# Patient Record
Sex: Female | Born: 1935 | Hispanic: No | Marital: Single | Smoking: Never smoker
Health system: Southern US, Community
[De-identification: ages and names within clinical notes are randomized; demographics above are authoritative.]

## PROBLEM LIST (undated history)

## (undated) DIAGNOSIS — E079 Disorder of thyroid, unspecified: Secondary | ICD-10-CM

## (undated) DIAGNOSIS — E538 Deficiency of other specified B group vitamins: Secondary | ICD-10-CM

## (undated) DIAGNOSIS — E049 Nontoxic goiter, unspecified: Secondary | ICD-10-CM

## (undated) HISTORY — PX: THYROIDECTOMY, PARTIAL: SHX18

---

## 2015-09-08 ENCOUNTER — Inpatient Hospital Stay (HOSPITAL_COMMUNITY)
Admission: EM | Admit: 2015-09-08 | Discharge: 2015-09-12 | DRG: 480 | Disposition: A | Discharge status: Skilled Nursing Facility | Payer: Medicare Other | Attending: Internal Medicine | Admitting: Internal Medicine

## 2015-09-08 ENCOUNTER — Encounter (HOSPITAL_COMMUNITY): Payer: Self-pay | Admitting: Emergency Medicine

## 2015-09-08 ENCOUNTER — Emergency Department (HOSPITAL_COMMUNITY): Payer: Medicare Other

## 2015-09-08 DIAGNOSIS — D500 Iron deficiency anemia secondary to blood loss (chronic): Secondary | ICD-10-CM | POA: Diagnosis present

## 2015-09-08 DIAGNOSIS — S72141A Displaced intertrochanteric fracture of right femur, initial encounter for closed fracture: Secondary | ICD-10-CM | POA: Diagnosis present

## 2015-09-08 DIAGNOSIS — N390 Urinary tract infection, site not specified: Secondary | ICD-10-CM | POA: Diagnosis present

## 2015-09-08 DIAGNOSIS — E871 Hypo-osmolality and hyponatremia: Secondary | ICD-10-CM | POA: Diagnosis not present

## 2015-09-08 DIAGNOSIS — D638 Anemia in other chronic diseases classified elsewhere: Secondary | ICD-10-CM | POA: Diagnosis not present

## 2015-09-08 DIAGNOSIS — G92 Toxic encephalopathy: Secondary | ICD-10-CM | POA: Diagnosis not present

## 2015-09-08 DIAGNOSIS — R7989 Other specified abnormal findings of blood chemistry: Secondary | ICD-10-CM | POA: Diagnosis present

## 2015-09-08 DIAGNOSIS — I10 Essential (primary) hypertension: Secondary | ICD-10-CM | POA: Diagnosis present

## 2015-09-08 DIAGNOSIS — E440 Moderate protein-calorie malnutrition: Secondary | ICD-10-CM | POA: Diagnosis present

## 2015-09-08 DIAGNOSIS — E0781 Sick-euthyroid syndrome: Secondary | ICD-10-CM | POA: Diagnosis present

## 2015-09-08 DIAGNOSIS — B9620 Unspecified Escherichia coli [E. coli] as the cause of diseases classified elsewhere: Secondary | ICD-10-CM | POA: Diagnosis present

## 2015-09-08 DIAGNOSIS — Y92129 Unspecified place in nursing home as the place of occurrence of the external cause: Secondary | ICD-10-CM

## 2015-09-08 DIAGNOSIS — R52 Pain, unspecified: Secondary | ICD-10-CM

## 2015-09-08 DIAGNOSIS — W010XXA Fall on same level from slipping, tripping and stumbling without subsequent striking against object, initial encounter: Secondary | ICD-10-CM | POA: Diagnosis present

## 2015-09-08 DIAGNOSIS — S72141D Displaced intertrochanteric fracture of right femur, subsequent encounter for closed fracture with routine healing: Secondary | ICD-10-CM | POA: Diagnosis not present

## 2015-09-08 DIAGNOSIS — E538 Deficiency of other specified B group vitamins: Secondary | ICD-10-CM | POA: Diagnosis present

## 2015-09-08 DIAGNOSIS — S72001A Fracture of unspecified part of neck of right femur, initial encounter for closed fracture: Secondary | ICD-10-CM

## 2015-09-08 DIAGNOSIS — D72829 Elevated white blood cell count, unspecified: Secondary | ICD-10-CM | POA: Diagnosis present

## 2015-09-08 DIAGNOSIS — D62 Acute posthemorrhagic anemia: Secondary | ICD-10-CM | POA: Diagnosis not present

## 2015-09-08 DIAGNOSIS — Z66 Do not resuscitate: Secondary | ICD-10-CM | POA: Diagnosis present

## 2015-09-08 DIAGNOSIS — M25551 Pain in right hip: Secondary | ICD-10-CM | POA: Diagnosis present

## 2015-09-08 DIAGNOSIS — Z01811 Encounter for preprocedural respiratory examination: Secondary | ICD-10-CM

## 2015-09-08 DIAGNOSIS — G9340 Encephalopathy, unspecified: Secondary | ICD-10-CM | POA: Diagnosis not present

## 2015-09-08 DIAGNOSIS — Z6821 Body mass index (BMI) 21.0-21.9, adult: Secondary | ICD-10-CM | POA: Diagnosis not present

## 2015-09-08 HISTORY — DX: Nontoxic goiter, unspecified: E04.9

## 2015-09-08 HISTORY — DX: Disorder of thyroid, unspecified: E07.9

## 2015-09-08 HISTORY — DX: Deficiency of other specified B group vitamins: E53.8

## 2015-09-08 LAB — BASIC METABOLIC PANEL
Anion gap: 10 (ref 5–15)
BUN: 19 mg/dL (ref 6–20)
CO2: 24 mmol/L (ref 22–32)
Calcium: 9.2 mg/dL (ref 8.9–10.3)
Chloride: 105 mmol/L (ref 101–111)
Creatinine, Ser: 0.77 mg/dL (ref 0.44–1.00)
GFR calc Af Amer: >60 mL/min (ref >60)
Glucose, Bld: 135 mg/dL — ABNORMAL HIGH (ref 65–99)
POTASSIUM: 4.2 mmol/L (ref 3.5–5.1)
SODIUM: 139 mmol/L (ref 135–145)

## 2015-09-08 LAB — URINALYSIS, ROUTINE W REFLEX MICROSCOPIC
GLUCOSE, UA: NEGATIVE mg/dL
HGB URINE DIPSTICK: NEGATIVE
Ketones, ur: NEGATIVE mg/dL
Nitrite: POSITIVE — AB
PH: 6.5 (ref 5.0–8.0)
Protein, ur: NEGATIVE mg/dL
SPECIFIC GRAVITY, URINE: 1.019 (ref 1.005–1.030)
Urobilinogen, UA: 1 mg/dL (ref 0.0–1.0)

## 2015-09-08 LAB — CBC WITH DIFFERENTIAL/PLATELET
BASOS ABS: 0 10*3/uL (ref 0.0–0.1)
Basophils Relative: 0 %
EOS ABS: 0.1 10*3/uL (ref 0.0–0.7)
EOS PCT: 1 %
HCT: 35 % — ABNORMAL LOW (ref 36.0–46.0)
Hemoglobin: 11.2 g/dL — ABNORMAL LOW (ref 12.0–15.0)
LYMPHS PCT: 10 %
Lymphs Abs: 1.3 10*3/uL (ref 0.7–4.0)
MCH: 28.7 pg (ref 26.0–34.0)
MCHC: 32 g/dL (ref 30.0–36.0)
MCV: 89.7 fL (ref 78.0–100.0)
Monocytes Absolute: 0.5 10*3/uL (ref 0.1–1.0)
Monocytes Relative: 4 %
Neutro Abs: 10.7 10*3/uL — ABNORMAL HIGH (ref 1.7–7.7)
Neutrophils Relative %: 85 %
PLATELETS: 214 10*3/uL (ref 150–400)
RBC: 3.9 MIL/uL (ref 3.87–5.11)
RDW: 13.8 % (ref 11.5–15.5)
WBC: 12.6 10*3/uL — AB (ref 4.0–10.5)

## 2015-09-08 LAB — URINE MICROSCOPIC-ADD ON

## 2015-09-08 LAB — ABO/RH: ABO/RH(D): A POS

## 2015-09-08 LAB — PROTIME-INR
INR: 1.1 (ref 0.00–1.49)
PROTHROMBIN TIME: 14.4 s (ref 11.6–15.2)

## 2015-09-08 MED ORDER — MORPHINE SULFATE (PF) 2 MG/ML IV SOLN
1.0000 mg | Freq: Once | INTRAVENOUS | Status: AC
  Administered 2015-09-08: 1 mg via INTRAVENOUS
  Filled 2015-09-08: qty 1

## 2015-09-08 MED ORDER — MORPHINE SULFATE (PF) 2 MG/ML IV SOLN
0.5000 mg | INTRAVENOUS | Status: DC | PRN
  Administered 2015-09-09 (×2): 0.5 mg via INTRAVENOUS
  Filled 2015-09-08 (×2): qty 1

## 2015-09-08 MED ORDER — METOPROLOL TARTRATE 1 MG/ML IV SOLN
2.5000 mg | Freq: Two times a day (BID) | INTRAVENOUS | Status: AC
  Administered 2015-09-08 – 2015-09-09 (×3): 2.5 mg via INTRAVENOUS
  Filled 2015-09-08 (×3): qty 5

## 2015-09-08 MED ORDER — HYDROMORPHONE HCL 1 MG/ML IJ SOLN: 1.0000 mg | INTRAMUSCULAR | Status: DC | PRN

## 2015-09-08 MED ORDER — BISACODYL 10 MG RE SUPP: 10.0000 mg | Freq: Every day | RECTAL | Status: DC | PRN

## 2015-09-08 MED ORDER — DEXTROSE 5 % IV SOLN
1.0000 g | Freq: Every day | INTRAVENOUS | Status: DC
  Administered 2015-09-08 – 2015-09-11 (×4): 1 g via INTRAVENOUS
  Filled 2015-09-08 (×5): qty 10

## 2015-09-08 MED ORDER — METHOCARBAMOL 1000 MG/10ML IJ SOLN
500.0000 mg | Freq: Four times a day (QID) | INTRAVENOUS | Status: DC | PRN
  Filled 2015-09-08: qty 5

## 2015-09-08 MED ORDER — METHOCARBAMOL 500 MG PO TABS
500.0000 mg | ORAL_TABLET | Freq: Four times a day (QID) | ORAL | Status: DC | PRN
  Administered 2015-09-09: 500 mg via ORAL
  Filled 2015-09-08: qty 1

## 2015-09-08 MED ORDER — POLYETHYLENE GLYCOL 3350 17 G PO PACK: 17.0000 g | PACK | Freq: Every day | ORAL | Status: DC | PRN

## 2015-09-08 MED ORDER — ONDANSETRON HCL 4 MG/2ML IJ SOLN
4.0000 mg | Freq: Once | INTRAMUSCULAR | Status: AC
  Administered 2015-09-08: 4 mg via INTRAVENOUS

## 2015-09-08 MED ORDER — ONDANSETRON HCL 4 MG/2ML IJ SOLN
INTRAMUSCULAR | Status: AC
  Filled 2015-09-08: qty 2

## 2015-09-08 MED ORDER — DOCUSATE SODIUM 100 MG PO CAPS
100.0000 mg | ORAL_CAPSULE | Freq: Two times a day (BID) | ORAL | Status: DC
  Administered 2015-09-09 – 2015-09-12 (×4): 100 mg via ORAL

## 2015-09-08 MED ORDER — SENNA 8.6 MG PO TABS
1.0000 | ORAL_TABLET | Freq: Two times a day (BID) | ORAL | Status: DC
  Administered 2015-09-09 – 2015-09-12 (×4): 8.6 mg via ORAL

## 2015-09-08 MED ORDER — HYDROCODONE-ACETAMINOPHEN 5-325 MG PO TABS
1.0000 | ORAL_TABLET | Freq: Four times a day (QID) | ORAL | Status: DC | PRN
  Administered 2015-09-09 (×2): 1 via ORAL
  Administered 2015-09-11 – 2015-09-12 (×3): 2 via ORAL
  Administered 2015-09-12: 1 via ORAL
  Filled 2015-09-08: qty 2
  Filled 2015-09-08: qty 1
  Filled 2015-09-08: qty 2
  Filled 2015-09-08: qty 1
  Filled 2015-09-08: qty 2
  Filled 2015-09-08: qty 1

## 2015-09-08 NOTE — ED Provider Notes (Signed)
CSN: [id number]     Arrival date & time 09/08/15  1753 History   First MD Initiated Contact with Patient 09/08/15 1800     Chief Complaint  Patient presents with  . Fall  . Hip Pain   HPI  [name] is a 81 year old female with PMHx of goiter presenting with a hip injury. She states that she was walking around her assisted living facility when she tripped over a piece of broken concrete. She fell forward onto her right side. She denies head injury or LOC. Pt states she had immediate hip pain. She describes the pain as "like a muscle ache" and rates it a 4/10. She is unable to move the right leg. She denies other injuries sustained in the fall. She has not had a hip injury in the past. She is not taking blood thinners. Denies fevers, chills, headache, chest pain, SOB, nausea, vomiting, numbness, tingling or loss of sensation in her right leg.  Past Medical History  Diagnosis Date  . Thyroid disease   . Goiter    Past Surgical History  Procedure Laterality Date  . Thyroidectomy, partial     Family History  Problem Relation Age of Onset  . Hypertension Other   . Stroke Mother   . Colon cancer Mother   . Cancer - Colon Father   . Cirrhosis Brother    Social History  Substance Use Topics  . Smoking status: Never Smoker   . Smokeless tobacco: Never Used  . Alcohol Use: No   OB History    No data available     Review of Systems  Constitutional: Negative for fever and chills.  HENT: Negative for congestion and sore throat.   Eyes: Negative for visual disturbance.  Respiratory: Negative for cough and shortness of breath.   Cardiovascular: Negative for chest pain.  Gastrointestinal: Negative for nausea, vomiting and abdominal pain.  Genitourinary: Negative for dysuria and flank pain.  Musculoskeletal: Positive for arthralgias and gait problem. Negative for neck pain.  Skin: Negative for wound.  Neurological: Negative for dizziness, syncope, numbness and headaches.   Hematological: Does not bruise/bleed easily.      Allergies  Penicillins  Home Medications   Prior to Admission medications   Not on File   BP 113/36 mmHg  Pulse 64  Temp(Src) 98.9 F (37.2 C) (Oral)  Resp 16  Ht  (1.651 m)  Wt 130 lb (58.968 kg)  BMI 21.63 kg/m2  SpO2 97% Physical Exam  Constitutional: She appears well-developed and well-nourished. No distress.  HENT:  Head: Normocephalic and atraumatic.  Eyes: Conjunctivae and EOM are normal. Pupils are equal, round, and reactive to light. Right eye exhibits no discharge. Left eye exhibits no discharge. No scleral icterus.  Neck: Normal range of motion.  Cardiovascular: Normal rate and regular rhythm.   Pedal pulses palpable Cap refill < 3  Pulmonary/Chest: Effort normal and breath sounds normal. No respiratory distress. She has no wheezes. She has no rales.  Abdominal: Soft. There is no tenderness.  Musculoskeletal: Normal range of motion.  Right leg shortened and externally rotated. TTP over lateral hip. FROM of ankle and toes.   Neurological: She is alert. No cranial nerve deficit. Coordination normal.  Unable to assess strength of right extremity 2/2 to pain. Sensation over lower legs and feet intact.   Skin: Skin is warm and dry.  Psychiatric: She has a normal mood and affect. Her behavior is normal.  Nursing note and vitals reviewed.  ED Course  Procedures (including critical care time) Labs Review Labs Reviewed  BASIC METABOLIC PANEL - Abnormal; Notable for the following:    Glucose, Bld 135 (*)    All other components within normal limits  CBC WITH DIFFERENTIAL/PLATELET - Abnormal; Notable for the following:    WBC 12.6 (*)    Hemoglobin 11.2 (*)    HCT 35.0 (*)    Neutro Abs 10.7 (*)    All other components within normal limits  URINALYSIS, ROUTINE W REFLEX MICROSCOPIC (NOT AT [facility]) - Abnormal; Notable for the following:    Color, Urine AMBER (*)    APPearance CLOUDY (*)    Bilirubin Urine  SMALL (*)    Nitrite POSITIVE (*)    Leukocytes, UA MODERATE (*)    All other components within normal limits  URINE MICROSCOPIC-ADD ON - Abnormal; Notable for the following:    Bacteria, UA MANY (*)    All other components within normal limits  CBC - Abnormal; Notable for the following:    RBC 3.26 (*)    Hemoglobin 9.2 (*)    HCT 29.0 (*)    All other components within normal limits  BASIC METABOLIC PANEL - Abnormal; Notable for the following:    Sodium 134 (*)    Glucose, Bld 175 (*)    Calcium 8.5 (*)    GFR calc non Af Amer 58 (*)    All other components within normal limits  TSH - Abnormal; Notable for the following:    TSH 0.176 (*)    All other components within normal limits  URINE CULTURE  PROTIME-INR  VITAMIN D 25 HYDROXY  TYPE AND SCREEN  ABO/RH    Imaging Review Dg Chest 1 View  09/08/2015   CLINICAL DATA:  Status post fall, with right hip fracture. Preoperative chest radiograph. Initial encounter.  EXAM: CHEST 1 VIEW  COMPARISON:  None.  FINDINGS: The lungs are well-aerated and clear. There is no evidence of focal opacification, pleural effusion or pneumothorax.  The cardiomediastinal silhouette is borderline normal in size. Diffuse calcification is noted along the descending thoracic aorta. No acute osseous abnormalities are seen. Degenerative change is noted at the acromioclavicular joints bilaterally.  IMPRESSION: No acute cardiopulmonary process seen. No displaced rib fractures identified.   Electronically Signed   By: Roanna Raider M.D.   On: 09/08/2015 22:24   Dg Hip Unilat  With Pelvis 2-3 Views Right  09/08/2015   CLINICAL DATA:  Resident Assisted living facility. Fell on RIGHT side. Pain.  EXAM: DG HIP (WITH OR WITHOUT PELVIS) 2-3V RIGHT  COMPARISON:  None.  FINDINGS: There is a comminuted intertrochanteric fracture with displacement of fragments. Slight varus angulation. No pelvic fracture. Osteopenia. Degenerative change lumbar spine.  IMPRESSION: Comminuted  intertrochanteric fracture with displacement of the fracture fragments. Slight varus angulation. No pelvic fracture.   Electronically Signed   By: Elsie Stain M.D.   On: 09/08/2015 19:01   I have personally reviewed and evaluated these images and lab results as part of my medical decision-making.   EKG Interpretation   Date/Time:  Sunday September 08 2015 20:49:54 EDT Ventricular Rate:  75 PR Interval:    QRS Duration: 92 QT Interval:  407 QTC Calculation: 455 R Axis:     Text Interpretation:  Atrial flutter with predominant 4:1 AV block RSR' in  V1 or V2, right VCD or RVH ED PHYSICIAN INTERPRETATION AVAILABLE IN [facility] Confirmed by TEST, Record ([id number]) on 09/09/2015 7:22:58 AM  MDM   Final diagnoses:  Hip fracture, right, closed, initial encounter (HCC)   Pt presenting after a fall with right leg deformity and pain. Denies other injuries sustained in fall. She is not taking blood thinners. VSS. Pt nontoxic appearing. Right leg is shortened and externally rotated. Leg neurovascularly intact. Hip xray shows displaced hip fracture. Consulted Dr. Magnus Ivan who will see pt but likely not be able to operate until tomorrow. Pt to be kept NPO for now. Pain controlled with morphine. Consulted to Dr. Adela Glimpse who will admit pt.     Alveta Heimlich, PA-C 09/09/15 1242  Elwin Mocha, MD 09/09/15 [phone]

## 2015-09-08 NOTE — ED Notes (Signed)
Bed: ZO10 Expected date:  Expected time:  Means of arrival:  Comments: EMS- hip injury with shortening and rotation

## 2015-09-08 NOTE — H&P (Signed)
PCP: [facility], Trying to go see Dr. Merlene Laughter   Referring provider Rolm Gala   Chief Complaint: Right hip pain  HPI: Shelly Glenn is a 81 y.o. female   has a past medical history of Thyroid disease and Goiter.   Presented with patient was walking on uneven concrete and fell down on her right side. Thereafter she had significant pain and there was shortening of her right leg. Patient is ambulatory at baseline resides at assisted living. EMS was called Patient describes significant pain and unable to ambulate. Patient does not have a regular doctor.  In ER she was found to have comminuted intertrochanteric fracture or displacement of fracture segments on the right. Dr. Rayburn Ma has been consulted and will see her in consult. Plan to keep her nothing by mouth for possible operative intervention tomorrow morning. Ptient not on any blood thinners. She reports no chest pain with ambulation. No shortness of breath. She used to take a medication for her thyroid but no longer on it.  Hospitalist was called for admission for right hip fracture  Review of Systems:    Pertinent positives include:  Leg pain  Constitutional:  No weight loss, night sweats, Fevers, chills, fatigue, weight loss  HEENT:  No headaches, Difficulty swallowing,Tooth/dental problems,Sore throat,  No sneezing, itching, ear ache, nasal congestion, post nasal drip,  Cardio-vascular:  No chest pain, Orthopnea, PND, anasarca, dizziness, palpitations.no Bilateral lower extremity swelling  GI:  No heartburn, indigestion, abdominal pain, nausea, vomiting, diarrhea, change in bowel habits, loss of appetite, melena, blood in stool, hematemesis Resp:  no shortness of breath at rest. No dyspnea on exertion, No excess mucus, no productive cough, No non-productive cough, No coughing up of blood.No change in color of mucus.No wheezing. Skin:  no rash or lesions. No jaundice GU:  no dysuria, change in color of urine, no urgency or  frequency. No straining to urinate.  No flank pain.  Musculoskeletal:  No joint pain or no joint swelling. No decreased range of motion. No back pain.  Psych:  No change in mood or affect. No depression or anxiety. No memory loss.  Neuro: no localizing neurological complaints, no tingling, no weakness, no double vision, no gait abnormality, no slurred speech, no confusion  Otherwise ROS are negative except for above, 10 systems were reviewed  Past Medical History: Past Medical History  Diagnosis Date  . Thyroid disease   . Goiter    Past Surgical History  Procedure Laterality Date  . Thyroidectomy, partial       Medications: Prior to Admission medications   Not on File    Allergies:   Allergies  Allergen Reactions  . Penicillins Itching    Social History:  Ambulatory independently     From facility assisted living   reports that she has never smoked. She has never used smokeless tobacco. She reports that she does not drink alcohol or use illicit drugs.    Family History: family history includes Cancer - Colon in her father; Cirrhosis in her brother; Colon cancer in her mother; Hypertension in her other; Stroke in her mother.    Physical Exam: Patient Vitals for the past 24 hrs:  BP Pulse Resp SpO2 Height Weight  09/08/15 2050 164/61 mmHg 73 18 99 % - -  09/08/15 1756 174/77 mmHg 96 18 98 %  (1.651 m) 58.968 kg (130 lb)    1. General:  in No Acute distress 2. Psychological: Alert and  riented 3. Head/ENT:  Dry Mucous Membranes                          Head Non traumatic, neck supple                          Normal  entition 4. SKIN:  decreased Skin turgor,  Skin clean Dry and intact no rash 5. Heart: Regular rate and rhythm no Murmur, Rub or gallop 6. Lungs: Clear to auscultation bilaterally, no wheezes or crackles   7. Abdomen: Soft, non-tender, Non distended 8. Lower extremities: no clubbing, cyanosis, or edema 9. Neurologically Grossly intact,  moving all 4 extremities equally 10. MSK: Normal range of motion right leg shortened  body mass index is 21.63 kg/(m^2).   Labs on Admission:   Results for orders placed or performed during the hospital encounter of 09/08/15 (from the past 24 hour(s))  Basic metabolic panel     Status: Abnormal   Collection Time: 09/08/15  7:41 PM  Result Value Ref Range   Sodium 139 135 - 145 mmol/L   Potassium 4.2 3.5 - 5.1 mmol/L   Chloride 105 101 - 111 mmol/L   CO2 24 22 - 32 mmol/L   Glucose, Bld 135 (H) 65 - 99 mg/dL   BUN 19 6 - 20 mg/dL   Creatinine, Ser 1.61 0.44 - 1.00 mg/dL   Calcium 9.2 8.9 - 09.6 mg/dL   GFR calc non Af Amer >60 >60 mL/min   GFR calc Af Amer >60 >60 mL/min   Anion gap 10 5 - 15  CBC WITH DIFFERENTIAL     Status: Abnormal   Collection Time: 09/08/15  7:41 PM  Result Value Ref Range   WBC 12.6 (H) 4.0 - 10.5 K/uL   RBC 3.90 3.87 - 5.11 MIL/uL   Hemoglobin 11.2 (L) 12.0 - 15.0 g/dL   HCT 04.5 (L) 40.9 - 81.1 %   MCV 89.7 78.0 - 100.0 fL   MCH 28.7 26.0 - 34.0 pg   MCHC 32.0 30.0 - 36.0 g/dL   RDW 91.4 78.2 - 95.6 %   Platelets 214 150 - 400 K/uL   Neutrophils Relative % 85 %   Neutro Abs 10.7 (H) 1.7 - 7.7 K/uL   Lymphocytes Relative 10 %   Lymphs Abs 1.3 0.7 - 4.0 K/uL   Monocytes Relative 4 %   Monocytes Absolute 0.5 0.1 - 1.0 K/uL   Eosinophils Relative 1 %   Eosinophils Absolute 0.1 0.0 - 0.7 K/uL   Basophils Relative 0 %   Basophils Absolute 0.0 0.0 - 0.1 K/uL  Protime-INR     Status: None   Collection Time: 09/08/15  7:41 PM  Result Value Ref Range   Prothrombin Time 14.4 11.6 - 15.2 seconds   INR 1.10 0.00 - 1.49  Type and screen     Status: None   Collection Time: 09/08/15  7:41 PM  Result Value Ref Range   ABO/RH(D) A POS    Antibody Screen NEG    Sample Expiration 09/11/2015     UA was also con to numerous to count positive nitrites and many bacteria evidence of UTI  No results found for: HGBA1C  Estimated Creatinine Clearance: 41.2  mL/min (by C-G formula based on Cr of 0.77).  BNP (last 3 results) No results for input(s): PROBNP in the last 8760 hours.  Other results:  I have pearsonaly reviewed this: ECG REPORT  Rate  75  Rhythm: Normal sinus rhythm ST&T Change: No ischemic changes QTC within normal limits  Filed Weights   09/08/15 1756  Weight: 58.968 kg (130 lb)     Cultures: No results found for: SDES, SPECREQUEST, CULT, REPTSTATUS   Radiological Exams on Admission: Dg Hip Unilat  With Pelvis 2-3 Views Right  09/08/2015   CLINICAL DATA:  Resident Assisted living facility. Fell on RIGHT side. Pain.  EXAM: DG HIP (WITH OR WITHOUT PELVIS) 2-3V RIGHT  COMPARISON:  None.  FINDINGS: There is a comminuted intertrochanteric fracture with displacement of fragments. Slight varus angulation. No pelvic fracture. Osteopenia. Degenerative change lumbar spine.  IMPRESSION: Comminuted intertrochanteric fracture with displacement of the fracture fragments. Slight varus angulation. No pelvic fracture.   Electronically Signed   By: Elsie Stain M.D.   On: 09/08/2015 19:01    Chart has been reviewed  Family   at  Bedside  plan of care was discussed with Niece  Assessment/Plan  87 yo F w no significant Past medical hx here with Right hip fracture after mechanical fall and UTI  Present on Admission:  . Closed right hip fracture (HCC) [facility]. Given advanced patient patient is at least moderate risk but she has been ambulatory without chest pains or shortness of breath. No further workup indicated prior to proceeding to OR, we'll start low-dose perioperative beta blocker  . UTI (lower urinary tract infection) - will start on Rocephin and await results of urine culture Leukocytosis in the setting UTI and stressed margination from fall  Prophylaxis: SCD    CODE STATUS: DNR/DNI as per patient   Disposition:    likely will need placement for rehabilitation            Other plan as per orders.  I have  spent a total of 55 min on this admission  Shelly Glenn 09/08/2015, 9:10 PM  [facility]  Pager [phone]   after 2 AM please page floor coverage PA If 7AM-7PM, please contact the day team taking care of the patient  [email or web address]  Password [email or web address]

## 2015-09-08 NOTE — ED Notes (Signed)
Pt presents via EMS from assisted living facility after walking over uneven concrete and falling onto her right side.  There is visible shortening and outward rotation of the right leg, and pt c/o 4/10 right thigh pain.  She is alert and oriented and ambulatory at baseline. Small skin tear on right elbow bandaged by EMS. All vs WNL en route.

## 2015-09-08 NOTE — Consult Note (Signed)
Reason for Consult:  Right hip fracture Referring Physician: EDP  Shelly Glenn is an 81 y.o. female.  HPI:   81 yo female resident of assisted living sustained an accidental mechanical fall this afternoon while up ambulating with her walker.  Was transported to the [facility] and found to have a displaced right hip intertrochanteric fracture.  She complains only of right hip pain.  Her niece is at the bedside.  Ortho is consulted to evaluate and treat the broken hip.  Past Medical History  Diagnosis Date  . Thyroid disease   . Goiter     Past Surgical History  Procedure Laterality Date  . Thyroidectomy, partial      No family history on file.  Social History:  reports that she has never smoked. She has never used smokeless tobacco. She reports that she does not drink alcohol or use illicit drugs.  Allergies:  Allergies  Allergen Reactions  . Penicillins Itching    Medications: I have reviewed the patient's current medications.  Results for orders placed or performed during the hospital encounter of 09/08/15 (from the past 48 hour(s))  Basic metabolic panel     Status: Abnormal   Collection Time: 09/08/15  7:41 PM  Result Value Ref Range   Sodium 139 135 - 145 mmol/L   Potassium 4.2 3.5 - 5.1 mmol/L   Chloride 105 101 - 111 mmol/L   CO2 24 22 - 32 mmol/L   Glucose, Bld 135 (H) 65 - 99 mg/dL   BUN 19 6 - 20 mg/dL   Creatinine, Ser 0.77 0.44 - 1.00 mg/dL   Calcium 9.2 8.9 - 10.3 mg/dL   GFR calc non Af Amer >60 >60 mL/min   GFR calc Af Amer >60 >60 mL/min    Comment: (NOTE) The eGFR has been calculated using the CKD EPI equation. This calculation has not been validated in all clinical situations. eGFR's persistently <60 mL/min signify possible Chronic Kidney Disease.    Anion gap 10 5 - 15  CBC WITH DIFFERENTIAL     Status: Abnormal   Collection Time: 09/08/15  7:41 PM  Result Value Ref Range   WBC 12.6 (H) 4.0 - 10.5 K/uL   RBC 3.90 3.87 - 5.11 MIL/uL   Hemoglobin 11.2 (L) 12.0 - 15.0 g/dL   HCT 35.0 (L) 36.0 - 46.0 %   MCV 89.7 78.0 - 100.0 fL   MCH 28.7 26.0 - 34.0 pg   MCHC 32.0 30.0 - 36.0 g/dL   RDW 13.8 11.5 - 15.5 %   Platelets 214 150 - 400 K/uL   Neutrophils Relative % 85 %   Neutro Abs 10.7 (H) 1.7 - 7.7 K/uL   Lymphocytes Relative 10 %   Lymphs Abs 1.3 0.7 - 4.0 K/uL   Monocytes Relative 4 %   Monocytes Absolute 0.5 0.1 - 1.0 K/uL   Eosinophils Relative 1 %   Eosinophils Absolute 0.1 0.0 - 0.7 K/uL   Basophils Relative 0 %   Basophils Absolute 0.0 0.0 - 0.1 K/uL  Protime-INR     Status: None   Collection Time: 09/08/15  7:41 PM  Result Value Ref Range   Prothrombin Time 14.4 11.6 - 15.2 seconds   INR 1.10 0.00 - 1.49  Type and screen     Status: None   Collection Time: 09/08/15  7:41 PM  Result Value Ref Range   ABO/RH(D) A POS    Antibody Screen NEG    Sample Expiration 09/11/2015  Dg Hip Unilat  With Pelvis 2-3 Views Right  09/08/2015   CLINICAL DATA:  Resident Assisted living facility. Fell on RIGHT side. Pain.  EXAM: DG HIP (WITH OR WITHOUT PELVIS) 2-3V RIGHT  COMPARISON:  None.  FINDINGS: There is a comminuted intertrochanteric fracture with displacement of fragments. Slight varus angulation. No pelvic fracture. Osteopenia. Degenerative change lumbar spine.  IMPRESSION: Comminuted intertrochanteric fracture with displacement of the fracture fragments. Slight varus angulation. No pelvic fracture.   Electronically Signed   By: Staci Righter M.D.   On: 09/08/2015 19:01    ROS Blood pressure 164/61, pulse 73, resp. rate 18, height 5' 5"  (1.651 m), weight 58.968 kg (130 lb), SpO2 99 %. Physical Exam  Constitutional: She is oriented to person, place, and time. She appears well-developed and well-nourished.  HENT:  Head: Normocephalic and atraumatic.  Eyes: Pupils are equal, round, and reactive to light.  Neck: Normal range of motion. Neck supple.  Cardiovascular: Normal rate.   Respiratory: Effort normal and  breath sounds normal.  GI: Bowel sounds are normal.  Musculoskeletal:       Right hip: She exhibits decreased range of motion, decreased strength, bony tenderness and deformity.  Neurological: She is alert and oriented to person, place, and time.  Skin: Skin is warm and dry.  Psychiatric: She has a normal mood and affect.    Assessment/Plan: Displaced right hip intertrochanteric fracture. 1) I spoke with her and her family in length about her right hip fracture and showed them the x-rays.  She does need surgical fixation of this fracture with a rod/hipscrew construct.  Due to OR availability, will plan on surgery late tomorrow.  She can eat now and have breakfast in the am.  The risks and benefits of surgery were discussed in detail.  General Medicine is consulted as well for medical clearance and admission.  BLACKMAN,CHRISTOPHER Y 09/08/2015, 9:02 PM

## 2015-09-09 ENCOUNTER — Inpatient Hospital Stay (HOSPITAL_COMMUNITY): Payer: Medicare Other | Admitting: Anesthesiology

## 2015-09-09 ENCOUNTER — Encounter (HOSPITAL_COMMUNITY): Payer: Self-pay | Admitting: Anesthesiology

## 2015-09-09 ENCOUNTER — Inpatient Hospital Stay (HOSPITAL_COMMUNITY): Payer: Medicare Other

## 2015-09-09 ENCOUNTER — Encounter (HOSPITAL_COMMUNITY)
Admission: EM | Disposition: A | Discharge status: Skilled Nursing Facility | Payer: Self-pay | Source: Home / Self Care | Attending: Internal Medicine

## 2015-09-09 DIAGNOSIS — D72829 Elevated white blood cell count, unspecified: Secondary | ICD-10-CM

## 2015-09-09 DIAGNOSIS — N390 Urinary tract infection, site not specified: Secondary | ICD-10-CM

## 2015-09-09 DIAGNOSIS — D638 Anemia in other chronic diseases classified elsewhere: Secondary | ICD-10-CM | POA: Diagnosis present

## 2015-09-09 DIAGNOSIS — I10 Essential (primary) hypertension: Secondary | ICD-10-CM | POA: Diagnosis present

## 2015-09-09 DIAGNOSIS — E871 Hypo-osmolality and hyponatremia: Secondary | ICD-10-CM | POA: Diagnosis not present

## 2015-09-09 DIAGNOSIS — S72141A Displaced intertrochanteric fracture of right femur, initial encounter for closed fracture: Principal | ICD-10-CM

## 2015-09-09 HISTORY — PX: INTRAMEDULLARY (IM) NAIL INTERTROCHANTERIC: SHX5875

## 2015-09-09 LAB — CBC
HEMATOCRIT: 29 % — AB (ref 36.0–46.0)
HEMOGLOBIN: 9.2 g/dL — AB (ref 12.0–15.0)
MCH: 28.2 pg (ref 26.0–34.0)
MCHC: 31.7 g/dL (ref 30.0–36.0)
MCV: 89 fL (ref 78.0–100.0)
Platelets: 197 10*3/uL (ref 150–400)
RBC: 3.26 MIL/uL — AB (ref 3.87–5.11)
RDW: 13.8 % (ref 11.5–15.5)
WBC: 8.1 10*3/uL (ref 4.0–10.5)

## 2015-09-09 LAB — BASIC METABOLIC PANEL
ANION GAP: 9 (ref 5–15)
BUN: 18 mg/dL (ref 6–20)
CHLORIDE: 101 mmol/L (ref 101–111)
CO2: 24 mmol/L (ref 22–32)
CREATININE: 0.85 mg/dL (ref 0.44–1.00)
Calcium: 8.5 mg/dL — ABNORMAL LOW (ref 8.9–10.3)
GFR calc non Af Amer: 58 mL/min — ABNORMAL LOW (ref >60)
Glucose, Bld: 175 mg/dL — ABNORMAL HIGH (ref 65–99)
POTASSIUM: 4.1 mmol/L (ref 3.5–5.1)
SODIUM: 134 mmol/L — AB (ref 135–145)

## 2015-09-09 LAB — TSH: TSH: 0.176 u[IU]/mL — AB (ref 0.350–4.500)

## 2015-09-09 LAB — SURGICAL PCR SCREEN
MRSA, PCR: NEGATIVE
STAPHYLOCOCCUS AUREUS: NEGATIVE

## 2015-09-09 SURGERY — FIXATION, FRACTURE, INTERTROCHANTERIC, WITH INTRAMEDULLARY ROD
Anesthesia: Spinal | Site: Hip | Laterality: Right

## 2015-09-09 MED ORDER — MEPERIDINE HCL 25 MG/ML IJ SOLN: 6.2500 mg | Freq: Once | INTRAMUSCULAR | Status: DC

## 2015-09-09 MED ORDER — FENTANYL CITRATE (PF) 100 MCG/2ML IJ SOLN
INTRAMUSCULAR | Status: DC | PRN
  Administered 2015-09-09: 25 ug via INTRAVENOUS

## 2015-09-09 MED ORDER — PROMETHAZINE HCL 25 MG/ML IJ SOLN
INTRAMUSCULAR | Status: AC
  Filled 2015-09-09: qty 1

## 2015-09-09 MED ORDER — ONDANSETRON HCL 4 MG PO TABS
4.0000 mg | ORAL_TABLET | Freq: Four times a day (QID) | ORAL | Status: DC | PRN
Start: 2015-09-09 — End: 2015-09-12
  Administered 2015-09-12: 4 mg via ORAL
  Filled 2015-09-09: qty 1

## 2015-09-09 MED ORDER — METOCLOPRAMIDE HCL 5 MG/ML IJ SOLN: 5.0000 mg | Freq: Three times a day (TID) | INTRAMUSCULAR | Status: DC | PRN

## 2015-09-09 MED ORDER — HYDROCODONE-ACETAMINOPHEN 5-325 MG PO TABS: 1.0000 | ORAL_TABLET | Freq: Four times a day (QID) | ORAL | Status: DC | PRN

## 2015-09-09 MED ORDER — KETAMINE HCL 10 MG/ML IJ SOLN
INTRAMUSCULAR | Status: AC
  Filled 2015-09-09: qty 1

## 2015-09-09 MED ORDER — PROPOFOL 500 MG/50ML IV EMUL
INTRAVENOUS | Status: DC | PRN
  Administered 2015-09-09: 10 ug/kg/min via INTRAVENOUS

## 2015-09-09 MED ORDER — METHOCARBAMOL 1000 MG/10ML IJ SOLN
500.0000 mg | Freq: Four times a day (QID) | INTRAVENOUS | Status: DC | PRN
  Filled 2015-09-09: qty 5

## 2015-09-09 MED ORDER — METHOCARBAMOL 1000 MG/10ML IJ SOLN
500.0000 mg | Freq: Four times a day (QID) | INTRAVENOUS | Status: DC | PRN
  Administered 2015-09-09 – 2015-09-10 (×2): 500 mg via INTRAVENOUS
  Filled 2015-09-09 (×5): qty 5

## 2015-09-09 MED ORDER — CEFAZOLIN SODIUM-DEXTROSE 2-3 GM-% IV SOLR
2.0000 g | Freq: Once | INTRAVENOUS | Status: AC
  Administered 2015-09-09: 2 g via INTRAVENOUS

## 2015-09-09 MED ORDER — ONDANSETRON HCL 4 MG/2ML IJ SOLN: 4.0000 mg | Freq: Once | INTRAMUSCULAR | Status: DC

## 2015-09-09 MED ORDER — PROMETHAZINE HCL 25 MG/ML IJ SOLN
6.2500 mg | INTRAMUSCULAR | Status: DC | PRN
  Administered 2015-09-09: 6.25 mg via INTRAVENOUS

## 2015-09-09 MED ORDER — CEFAZOLIN SODIUM-DEXTROSE 2-3 GM-% IV SOLR: 2.0000 g | Freq: Four times a day (QID) | INTRAVENOUS | Status: DC

## 2015-09-09 MED ORDER — SODIUM CHLORIDE 0.9 % IV SOLN
INTRAVENOUS | Status: AC
  Administered 2015-09-09: 09:00:00 via INTRAVENOUS

## 2015-09-09 MED ORDER — METHOCARBAMOL 500 MG PO TABS
500.0000 mg | ORAL_TABLET | Freq: Four times a day (QID) | ORAL | Status: DC | PRN
  Administered 2015-09-12: 500 mg via ORAL
  Filled 2015-09-09: qty 1

## 2015-09-09 MED ORDER — LACTATED RINGERS IV SOLN
INTRAVENOUS | Status: DC | PRN
  Administered 2015-09-09 (×2): via INTRAVENOUS

## 2015-09-09 MED ORDER — ACETAMINOPHEN 325 MG PO TABS
650.0000 mg | ORAL_TABLET | Freq: Four times a day (QID) | ORAL | Status: DC | PRN
  Administered 2015-09-10: 650 mg via ORAL
  Filled 2015-09-09: qty 2

## 2015-09-09 MED ORDER — MORPHINE SULFATE (PF) 2 MG/ML IV SOLN: 1.0000 mg | INTRAVENOUS | Status: DC | PRN

## 2015-09-09 MED ORDER — MENTHOL 3 MG MT LOZG: 1.0000 | LOZENGE | OROMUCOSAL | Status: DC | PRN

## 2015-09-09 MED ORDER — FENTANYL CITRATE (PF) 100 MCG/2ML IJ SOLN: 25.0000 ug | INTRAMUSCULAR | Status: DC | PRN

## 2015-09-09 MED ORDER — MEPERIDINE HCL 50 MG/ML IJ SOLN
INTRAMUSCULAR | Status: AC
  Filled 2015-09-09: qty 1

## 2015-09-09 MED ORDER — MORPHINE SULFATE (PF) 2 MG/ML IV SOLN: 0.5000 mg | INTRAVENOUS | Status: DC | PRN

## 2015-09-09 MED ORDER — OXYCODONE HCL 5 MG PO TABS: 5.0000 mg | ORAL_TABLET | ORAL | Status: DC | PRN

## 2015-09-09 MED ORDER — KETAMINE HCL 50 MG/ML IJ SOLN
INTRAMUSCULAR | Status: DC | PRN
  Administered 2015-09-09: 10 mg via INTRAMUSCULAR
  Administered 2015-09-09: 30 mg via INTRAMUSCULAR

## 2015-09-09 MED ORDER — HYDROMORPHONE HCL 1 MG/ML IJ SOLN: 1.0000 mg | INTRAMUSCULAR | Status: DC | PRN

## 2015-09-09 MED ORDER — ONDANSETRON HCL 4 MG/2ML IJ SOLN
4.0000 mg | Freq: Four times a day (QID) | INTRAMUSCULAR | Status: DC | PRN
  Filled 2015-09-09: qty 2

## 2015-09-09 MED ORDER — PROPOFOL 10 MG/ML IV BOLUS
INTRAVENOUS | Status: AC
  Filled 2015-09-09: qty 20

## 2015-09-09 MED ORDER — PHENOL 1.4 % MT LIQD
1.0000 | OROMUCOSAL | Status: DC | PRN
  Filled 2015-09-09: qty 177

## 2015-09-09 MED ORDER — PHENYLEPHRINE HCL 10 MG/ML IJ SOLN
10.0000 mg | INTRAVENOUS | Status: DC | PRN
  Administered 2015-09-09: 40 ug/min via INTRAVENOUS

## 2015-09-09 MED ORDER — METOCLOPRAMIDE HCL 10 MG PO TABS: 5.0000 mg | ORAL_TABLET | Freq: Three times a day (TID) | ORAL | Status: DC | PRN

## 2015-09-09 MED ORDER — ONDANSETRON HCL 4 MG/2ML IJ SOLN
4.0000 mg | Freq: Four times a day (QID) | INTRAMUSCULAR | Status: DC | PRN
  Administered 2015-09-09: 4 mg via INTRAVENOUS

## 2015-09-09 MED ORDER — ONDANSETRON HCL 4 MG/2ML IJ SOLN
INTRAMUSCULAR | Status: AC
  Filled 2015-09-09: qty 2

## 2015-09-09 MED ORDER — ACETAMINOPHEN 650 MG RE SUPP: 650.0000 mg | Freq: Four times a day (QID) | RECTAL | Status: DC | PRN

## 2015-09-09 MED ORDER — FENTANYL CITRATE (PF) 100 MCG/2ML IJ SOLN
INTRAMUSCULAR | Status: AC
  Filled 2015-09-09: qty 4

## 2015-09-09 MED ORDER — CEFAZOLIN SODIUM-DEXTROSE 2-3 GM-% IV SOLR
INTRAVENOUS | Status: AC
  Filled 2015-09-09: qty 50

## 2015-09-09 MED ORDER — SODIUM CHLORIDE 0.9 % IJ SOLN
INTRAMUSCULAR | Status: AC
  Filled 2015-09-09: qty 10

## 2015-09-09 MED ORDER — ASPIRIN EC 325 MG PO TBEC
325.0000 mg | DELAYED_RELEASE_TABLET | Freq: Every day | ORAL | Status: DC
  Administered 2015-09-10 – 2015-09-12 (×3): 325 mg via ORAL
  Filled 2015-09-09 (×4): qty 1

## 2015-09-09 MED ORDER — BUPIVACAINE IN DEXTROSE 0.75-8.25 % IT SOLN
INTRATHECAL | Status: DC | PRN
  Administered 2015-09-09: 1.6 mL via INTRATHECAL

## 2015-09-09 SURGICAL SUPPLY — 42 items
BLADE SURG 15 STRL LF DISP TIS (BLADE) ×1 IMPLANT
BLADE SURG 15 STRL SS (BLADE) ×2
BNDG GAUZE ELAST 4 BULKY (GAUZE/BANDAGES/DRESSINGS) ×3 IMPLANT
COVER PERINEAL POST (MISCELLANEOUS) ×3 IMPLANT
COVER SURGICAL LIGHT HANDLE (MISCELLANEOUS) IMPLANT
DRAPE INCISE IOBAN 85X60 (DRAPES) ×3 IMPLANT
DRAPE PROXIMA HALF (DRAPES) ×3 IMPLANT
DRAPE STERI IOBAN 125X83 (DRAPES) ×3 IMPLANT
DRSG MEPILEX BORDER 4X4 (GAUZE/BANDAGES/DRESSINGS) ×6 IMPLANT
DRSG MEPILEX BORDER 4X8 (GAUZE/BANDAGES/DRESSINGS) IMPLANT
DRSG PAD ABDOMINAL 8X10 ST (GAUZE/BANDAGES/DRESSINGS) ×6 IMPLANT
DURAPREP 26ML APPLICATOR (WOUND CARE) ×3 IMPLANT
ELECT REM PT RETURN 9FT ADLT (ELECTROSURGICAL) ×3
ELECTRODE REM PT RTRN 9FT ADLT (ELECTROSURGICAL) ×1 IMPLANT
FACESHIELD WRAPAROUND (MASK) ×3 IMPLANT
GAUZE XEROFORM 5X9 LF (GAUZE/BANDAGES/DRESSINGS) IMPLANT
GLOVE BIOGEL PI IND STRL 8 (GLOVE) ×2 IMPLANT
GLOVE BIOGEL PI INDICATOR 8 (GLOVE) ×4
GLOVE ECLIPSE 8.0 STRL XLNG CF (GLOVE) ×3 IMPLANT
GLOVE ORTHO TXT STRL SZ7.5 (GLOVE) ×3 IMPLANT
GOWN STRL REUS W/ TWL XL LVL3 (GOWN DISPOSABLE) IMPLANT
GOWN STRL REUS W/TWL LRG LVL3 (GOWN DISPOSABLE) ×3 IMPLANT
GOWN STRL REUS W/TWL XL LVL3 (GOWN DISPOSABLE)
GUIDEPIN 3.2X17.5 THRD DISP (PIN) ×3 IMPLANT
HFN RH 130 DEG 9MM X 340MM (Nail) ×3 IMPLANT
HIP FRAC NAIL LAG SCR 10.5X100 (Orthopedic Implant) ×2 IMPLANT
KIT BASIN OR (CUSTOM PROCEDURE TRAY) ×3 IMPLANT
KIT ROOM TURNOVER OR (KITS) ×3 IMPLANT
MANIFOLD NEPTUNE II (INSTRUMENTS) ×3 IMPLANT
NS IRRIG 1000ML POUR BTL (IV SOLUTION) ×3 IMPLANT
PACK GENERAL/GYN (CUSTOM PROCEDURE TRAY) ×3 IMPLANT
PAD ARMBOARD 7.5X6 YLW CONV (MISCELLANEOUS) ×6 IMPLANT
PAD CAST 4YDX4 CTTN HI CHSV (CAST SUPPLIES) IMPLANT
PADDING CAST COTTON 4X4 STRL (CAST SUPPLIES)
SCREW CANN THRD AFF 10.5X100 (Orthopedic Implant) ×1 IMPLANT
STAPLER VISISTAT 35W (STAPLE) IMPLANT
SUT VIC AB 0 CT1 27 (SUTURE) ×4
SUT VIC AB 0 CT1 27XBRD ANBCTR (SUTURE) ×2 IMPLANT
SUT VIC AB 2-0 CT1 27 (SUTURE) ×4
SUT VIC AB 2-0 CT1 TAPERPNT 27 (SUTURE) ×2 IMPLANT
TOWEL OR 17X24 6PK STRL BLUE (TOWEL DISPOSABLE) ×3 IMPLANT
TOWEL OR 17X26 10 PK STRL BLUE (TOWEL DISPOSABLE) ×3 IMPLANT

## 2015-09-09 NOTE — Anesthesia Preprocedure Evaluation (Addendum)
Anesthesia Evaluation  Patient identified by MRN, date of birth, ID band Patient awake    Reviewed: Allergy & Precautions, NPO status , Patient's Chart, lab work & pertinent test results  Airway Mallampati: II  TM Distance: >3 FB Neck ROM: Full    Dental   Pulmonary neg pulmonary ROS,    breath sounds clear to auscultation       Cardiovascular hypertension,  Rhythm:Regular Rate:Normal     Neuro/Psych    GI/Hepatic negative GI ROS, Neg liver ROS,   Endo/Other  negative endocrine ROS  Renal/GU negative Renal ROS     Musculoskeletal   Abdominal   Peds  Hematology   Anesthesia Other Findings   Reproductive/Obstetrics                            Anesthesia Physical Anesthesia Plan  ASA: III  Anesthesia Plan: Spinal   Post-op Pain Management:    Induction: Intravenous  Airway Management Planned: Simple Face Mask  Additional Equipment:   Intra-op Plan:   Post-operative Plan:   Informed Consent: I have reviewed the patients History and Physical, chart, labs and discussed the procedure including the risks, benefits and alternatives for the proposed anesthesia with the patient or authorized representative who has indicated his/her understanding and acceptance.   Dental advisory given  Plan Discussed with: CRNA and Anesthesiologist  Anesthesia Plan Comments:         Anesthesia Quick Evaluation

## 2015-09-09 NOTE — Anesthesia Procedure Notes (Signed)
Spinal Patient location during procedure: OR Start time: 09/09/2015 5:39 PM End time: 09/09/2015 5:50 PM Reason for block: at surgeon's request Staffing Anesthesiologist: Finis Bud Resident/CRNA: Anne Fu Performed by: resident/CRNA and anesthesiologist  Preanesthetic Checklist Completed: patient identified, site marked, surgical consent, pre-op evaluation, timeout performed, IV checked, risks and benefits discussed, monitors and equipment checked and at surgeon's request Spinal Block Patient position: sitting Approach: midline Location: L3-4 Injection technique: single-shot Needle Needle type: Spinocan  Needle gauge: 22 G Needle length: 9 cm Assessment Sensory level: T6 Additional Notes Expiration date of kit checked and confirmed. Patient tolerated procedure well, without complications. X 2 attempt with noted clear CSF return. Loss of motor and sensory on exam post injection.

## 2015-09-09 NOTE — Progress Notes (Signed)
Patient ID: Shelly Glenn, female   DOB: 11-29-1921, 81 y.o.   MRN: [id number]  [facility] PROGRESS NOTE  Lailany Enoch ION:[id number] DOB: Dec 10, 1920 DOA: 09/08/2015 PCP: No primary care provider on file.   Brief narrative:    81 y.o. female presented to [facility] ED after an episode of fall while walking on an uneven concrete. She fell on right side and has sustained right hip fracture. Ortho team consulted and [facility] asked to admit.   Assessment/Plan:    Principal Problem:   Closed intertrochanteric fracture of right hip (HCC) - plan to take to OR this afternoon - keep NPO for now and provide analgesia as needed - appreciate ortho team assistance   Active Problems:   UTI (lower urinary tract infection) - continue rocephin day #2 - follow up on urine cultures    Leukocytosis - likely multifactorial and secondary to hip fracture and UTI - WNL this AM - repeat CBC in AM    Accelerated hypertension - SBP in 170's on admission and likely exacerbated by pain - BP more stable this AM - continue Metoprolol IV BID for now and change to PO post op    Acute hyponatremia - slight drop overnight, provide IVF 75 cc/hr for next 12 hours  - repeat BMP in AM    Anemia of chronic disease, ? IDA - drop in Hg since admission - anemia panel requested, unknown baseline Hg - CBC in AM  DVT prophylaxis - SCD's  Code Status: DNR Family Communication:  plan of care discussed with the patient Disposition Plan: not ready for d/c yet   IV access:  Peripheral IV  Procedures and diagnostic studies:    Dg Chest 1 View 09/08/2015   No acute cardiopulmonary process seen. No displaced rib fractures identified.    Dg Hip Unilat  With Pelvis 2-3 Views Right 09/08/2015  Comminuted intertrochanteric fracture with displacement of the fracture fragments. Slight varus angulation.   Medical Consultants:  Ortho   Other Consultants:  None  IAnti-Infectives:   Rocephin 10/02 -->  Debbora Presto, MD  [facility] Pager [phone]  If 7PM-7AM, please contact night-coverage [email or web address] Password [email or web address] 09/09/2015, 8:08 AM   LOS: 1 day   HPI/Subjective: No events overnight. Still with right hip pain.  Objective: Filed Vitals:   09/08/15 2145 09/08/15 2213 09/08/15 2230 09/09/15 0535  BP:  151/58 152/56 134/50  Pulse: 73 73 81 73  Temp:  99 F (37.2 C) 98.7 F (37.1 C) 98.9 F (37.2 C)  TempSrc:  Oral Oral Oral  Resp: Height:      Weight:      SpO2: 100% 100% 100% 98%    Intake/Output Summary (Last 24 hours) at 09/09/15 0808 Last data filed at 09/09/15 0536  Gross per 24 hour  Intake    170 ml  Output    300 ml  Net   -130 ml    Exam:   General:  Pt is alert, follows commands appropriately, not in acute distress  Cardiovascular: Regular rate and rhythm, no rubs, no gallops  Respiratory: Clear to auscultation bilaterally, no wheezing, no crackles, no rhonchi  Abdomen: Soft, non tender, non distended, bowel sounds present, no guarding  Extremities: pulses DP and PT palpable bilaterally  Neuro: Grossly nonfocal  Data Reviewed: Basic Metabolic Panel:  Recent Labs Lab 09/08/15 1941 09/09/15 0455  NA 139 134*  K 4.2 4.1  CL 105 101  CO2 24 24  GLUCOSE 135* 175*  BUN 19 18  CREATININE 0.77 0.85  CALCIUM 9.2 8.5*   CBC:  Recent Labs Lab 09/08/15 1941 09/09/15 0455  WBC 12.6* 8.1  NEUTROABS 10.7*  --   HGB 11.2* 9.2*  HCT 35.0* 29.0*  MCV 89.7 89.0  PLT 214 197   Scheduled Meds: . cefTRIAXone (ROCEPHIN) IVPB 1 gram/50 mL D5W  1 g Intravenous QHS  . docusate sodium  100 mg Oral BID  . metoprolol  2.5 mg Intravenous Q12H  . senna  1 tablet Oral BID   Continuous Infusions:

## 2015-09-09 NOTE — Progress Notes (Signed)
Patient ID: Shelly Glenn, female   DOB: [birth year], 81 y.o.   MRN: [id number] Surgery planned for late today around 5 pm to address her right hip fracture.

## 2015-09-09 NOTE — Transfer of Care (Signed)
Immediate Anesthesia Transfer of Care Note  Patient: Shelly Glenn  Procedure(s) Performed: Procedure(s): INTRAMEDULLARY (IM) NAIL INTERTROCHANTRIC (Right)  Patient Location: PACU  Anesthesia Type:Spinal  Level of Consciousness: awake  Airway & Oxygen Therapy: Patient Spontanous Breathing and Patient connected to face mask oxygen  Post-op Assessment: Report given to RN and Post -op Vital signs reviewed and stable  Post vital signs: Reviewed and stable  Last Vitals:  Filed Vitals:   09/09/15 1600  BP: 131/54  Pulse: 64  Temp: 37.2 C  Resp: 18    Complications: No apparent anesthesia complications

## 2015-09-09 NOTE — Brief Op Note (Signed)
09/08/2015 - 09/09/2015  7:12 PM  PATIENT:  Shelly Glenn  81 y.o. female  PRE-OPERATIVE DIAGNOSIS:  right intertrochanteric hip fracture  POST-OPERATIVE DIAGNOSIS:  right intertrochanteric hip fracture  PROCEDURE:  Procedure(s): INTRAMEDULLARY (IM) NAIL INTERTROCHANTRIC (Right)  SURGEON:  Surgeon(s) and Role:    * Kathryne Hitch, MD - Primary  ANESTHESIA:   spinal  EBL:  Total I/O In: 1000 [I.V.:1000] Out: -   BLOOD ADMINISTERED:none  DRAINS: none   LOCAL MEDICATIONS USED:  NONE  SPECIMEN:  No Specimen  DISPOSITION OF SPECIMEN:  N/A  COUNTS:  YES  TOURNIQUET:  * No tourniquets in log *  DICTATION: .Other Dictation: Dictation Number [id number]  PLAN OF CARE: Admit to inpatient   PATIENT DISPOSITION:  PACU - hemodynamically stable.   Delay start of Pharmacological VTE agent (>24hrs) due to surgical blood loss or risk of bleeding: no

## 2015-09-09 NOTE — Anesthesia Postprocedure Evaluation (Signed)
  Anesthesia Post-op Note  Patient: Shelly Glenn  Procedure(s) Performed: Procedure(s): INTRAMEDULLARY (IM) NAIL INTERTROCHANTRIC (Right)  Patient Location: PACU  Anesthesia Type:Spinal  Level of Consciousness: awake  Airway and Oxygen Therapy: Patient Spontanous Breathing  Post-op Pain: none  Post-op Assessment: Post-op Vital signs reviewed LLE Motor Response: Purposeful movement LLE Sensation: Full sensation RLE Motor Response: Purposeful movement RLE Sensation: Full sensation      Post-op Vital Signs: Reviewed  Last Vitals:  Filed Vitals:   09/09/15 2020  BP: 145/51  Pulse: 67  Temp: 36.6 C  Resp: 18    Complications: No apparent anesthesia complications

## 2015-09-10 ENCOUNTER — Encounter (HOSPITAL_COMMUNITY): Payer: Self-pay | Admitting: Orthopaedic Surgery

## 2015-09-10 DIAGNOSIS — G9340 Encephalopathy, unspecified: Secondary | ICD-10-CM | POA: Diagnosis not present

## 2015-09-10 LAB — RETICULOCYTES
RBC.: 2.61 MIL/uL — AB (ref 3.87–5.11)
RETIC CT PCT: 1.4 % (ref 0.4–3.1)
Retic Count, Absolute: 36.5 10*3/uL (ref 19.0–186.0)

## 2015-09-10 LAB — FOLATE: FOLATE: 10.1 ng/mL (ref >5.9)

## 2015-09-10 LAB — BASIC METABOLIC PANEL
Anion gap: 8 (ref 5–15)
BUN: 15 mg/dL (ref 6–20)
CHLORIDE: 103 mmol/L (ref 101–111)
CO2: 25 mmol/L (ref 22–32)
CREATININE: 0.74 mg/dL (ref 0.44–1.00)
Calcium: 8.4 mg/dL — ABNORMAL LOW (ref 8.9–10.3)
GFR calc Af Amer: >60 mL/min (ref >60)
GFR calc non Af Amer: >60 mL/min (ref >60)
Glucose, Bld: 126 mg/dL — ABNORMAL HIGH (ref 65–99)
POTASSIUM: 3.8 mmol/L (ref 3.5–5.1)
Sodium: 136 mmol/L (ref 135–145)

## 2015-09-10 LAB — CBC
HEMATOCRIT: 22.9 % — AB (ref 36.0–46.0)
HEMOGLOBIN: 7.6 g/dL — AB (ref 12.0–15.0)
MCH: 29.1 pg (ref 26.0–34.0)
MCHC: 33.2 g/dL (ref 30.0–36.0)
MCV: 87.7 fL (ref 78.0–100.0)
Platelets: 168 10*3/uL (ref 150–400)
RBC: 2.61 MIL/uL — AB (ref 3.87–5.11)
RDW: 13.7 % (ref 11.5–15.5)
WBC: 8.3 10*3/uL (ref 4.0–10.5)

## 2015-09-10 LAB — PREPARE RBC (CROSSMATCH)

## 2015-09-10 LAB — IRON AND TIBC
Iron: 22 ug/dL — ABNORMAL LOW (ref 28–170)
SATURATION RATIOS: 12 % (ref 10.4–31.8)
TIBC: 183 ug/dL — AB (ref 250–450)
UIBC: 161 ug/dL

## 2015-09-10 LAB — VITAMIN D 25 HYDROXY (VIT D DEFICIENCY, FRACTURES): Vit D, 25-Hydroxy: 6.3 ng/mL — ABNORMAL LOW (ref 30.0–100.0)

## 2015-09-10 LAB — FERRITIN: Ferritin: 344 ng/mL — ABNORMAL HIGH (ref 11–307)

## 2015-09-10 LAB — VITAMIN B12: Vitamin B-12: 99 pg/mL — ABNORMAL LOW (ref 180–914)

## 2015-09-10 MED ORDER — OXYCODONE-ACETAMINOPHEN 5-325 MG PO TABS: 1.0000 | ORAL_TABLET | ORAL | Status: AC | PRN

## 2015-09-10 MED ORDER — SODIUM CHLORIDE 0.9 % IV SOLN
Freq: Once | INTRAVENOUS | Status: AC
  Administered 2015-09-10: 15:00:00 via INTRAVENOUS

## 2015-09-10 MED ORDER — ASPIRIN 325 MG PO TBEC: 325.0000 mg | DELAYED_RELEASE_TABLET | Freq: Every day | ORAL | Status: AC

## 2015-09-10 MED ORDER — HALOPERIDOL LACTATE 5 MG/ML IJ SOLN
1.0000 mg | Freq: Once | INTRAMUSCULAR | Status: AC
  Administered 2015-09-10: 1 mg via INTRAVENOUS

## 2015-09-10 MED ORDER — HALOPERIDOL LACTATE 5 MG/ML IJ SOLN
INTRAMUSCULAR | Status: AC
  Filled 2015-09-10: qty 1

## 2015-09-10 MED ORDER — HALOPERIDOL LACTATE 5 MG/ML IJ SOLN
INTRAMUSCULAR | Status: AC
Start: 2015-09-10 — End: 2015-09-10
  Filled 2015-09-10: qty 1

## 2015-09-10 NOTE — Clinical Social Work Note (Signed)
Clinical Social Work Assessment  Patient Details  Name: Shelly Glenn MRN: [id number] Date of Birth: 26-Apr-1921  Date of referral:  09/10/15               Reason for consult:  Discharge Planning, Facility Placement                Permission sought to share information with:  Facility Art therapist granted to share information::  Yes, Verbal Permission Granted  Name::        Agency::     Relationship::     Contact Information:     Housing/Transportation Living arrangements for the past 2 months:  [place] of Information:  Patient, Other (Comment Required) (niece) Patient Interpreter Needed:  None Criminal Activity/Legal Involvement Pertinent to Current Situation/Hospitalization:  No - Comment as needed Significant Relationships:  Other Family Members Lives with:  Self Do you feel safe going back to the place where you live?   (ST Rehab needed.) Need for family participation in patient care:  Yes (Comment)  Care giving concerns:  Pt's care cannot be managed at home following hospital d/c.   Social Worker assessment / plan:  Pt hospitalized on 09/08/15 with a hip fx. Surgery has been completed. CSW met with pt / niece to assist with d/c planning. Pt is from [place]. PT has recommended ST rehab at D/c. Pt has a SNF bed at [facility] when she is ready for d/c.   Employment status:  Retired Forensic scientist:  Medicare PT Recommendations:  [place] / Referral to community resources:  [place]  Patient/Family's Response to care: Pt / niece feel rehab will be needed at d/c.  Patient/Family's Understanding of and Emotional Response to Diagnosis, Current Treatment, and Prognosis: Pt / niece are aware of medical status.  Pt is eager to return to [facility].   Emotional Assessment Appearance:  Appears stated age Attitude/Demeanor/Rapport:  Other  (cooperative) Affect (typically observed):  Restless Orientation:  Oriented to Self, Oriented to Place, Oriented to  Time, Oriented to Situation Alcohol / Substance use:  Not Applicable Psych involvement (Current and /or in the community):  No (Comment)  Discharge Needs  Concerns to be addressed:  Discharge Planning Concerns Readmission within the last 30 days:  No Current discharge risk:  None Barriers to Discharge:  No Barriers Identified   Shelly Glenn, [place] 09/10/2015, 2:13 PM

## 2015-09-10 NOTE — Progress Notes (Addendum)
Patient ID: Shelly Glenn, female   DOB: [birth year], 81 y.o.   MRN: [id number]  [facility] PROGRESS NOTE  Shelly Glenn WUJ:[id number] DOB: November 12, 1921 DOA: 09/08/2015 PCP: No primary care provider on file.   Brief narrative:    81 y.o. female presented to [facility] ED after an episode of fall while walking on an uneven concrete. She fell on right side and has sustained right hip fracture. Ortho team consulted and [facility] asked to admit.   Assessment/Plan:    Principal Problem:   Closed and displaced intertrochanteric fracture of right hip (HCC) - s/p ORIF, post op day #1 - pt stable this AM but had episode of confusion overnight - PT evaluation requested, will likely need to be d/c to SNF when medically ready  - please note that pt has been placed on Aspirin 325 mg PO QD for DVT prophylaxis   Active Problems:   Acute on chronic blood loss anemia, post op related - drop in Hg from 11 --> 7.6 - order placed for 2 U PRBC transfusion today - anemia panel requested on admission, still pending  - CBC in AM    Acute encephalopathy - overnight 10/03 and possibly related to delirium in the setting of surgery, anesthesia - pt is calm this AM but confused, able to follow commands but not sure why she is in the hospital  - avoid sedating medications until mental status more clear - also d/c telemetry monitor as pt no longer meets criteria for tele     UTI (lower urinary tract infection) - continue rocephin day #3 - follow up on urine cultures    Leukocytosis - likely multifactorial and secondary to hip fracture and UTI - WNL this AM - repeat CBC in AM    Accelerated hypertension - SBP in 170's on admission and likely exacerbated by pain - BP more stable this AM and has not been on antihypertensives     Acute hyponatremia - pre renal in etiology - IVF have been provided and Na is now WNL   DVT prophylaxis - SCD's, aspirin 325 mg PO QD   Code Status: DNR Family Communication:  plan  of care discussed with the patient and niece at bedside  Disposition Plan: not ready for d/c yet but will need SNF  IV access:  Peripheral IV  Procedures and diagnostic studies:    Dg Chest 1 View 09/08/2015   No acute cardiopulmonary process seen. No displaced rib fractures identified.    Dg Hip Unilat  With Pelvis 2-3 Views Right 09/08/2015  Comminuted intertrochanteric fracture with displacement of the fracture fragments. Slight varus angulation.   Medical Consultants:  Ortho   Other Consultants:  PT Nutritionist   IAnti-Infectives:   Rocephin 10/02 -->  Debbora Presto, MD  [facility] Pager [phone]  If 7PM-7AM, please contact night-coverage [email or web address] Password [email or web address] 09/10/2015, 9:39 AM   LOS: 2 days   HPI/Subjective: No events overnight. Still with right hip pain.  Objective: Filed Vitals:   09/09/15 2225 09/09/15 2335 09/10/15 0231 09/10/15 0633  BP: 132/44 141/48 115/58 111/36  Pulse: 65 70 84 83  Temp: 98.6 F (37 C) 98.4 F (36.9 C) 98.6 F (37 C) 98.9 F (37.2 C)  TempSrc: Oral Oral Oral Oral  Resp: Height:      Weight:      SpO2: 100% 100% 95% 97%    Intake/Output Summary (Last 24 hours) at 09/10/15 0939 Last data filed at 09/10/15 8657  Gross per  24 hour  Intake 1667.5 ml  Output   1275 ml  Net  392.5 ml    Exam:   General:  Pt is alert but confused, follows commands appropriately, not in acute distress  Cardiovascular: Regular rate and rhythm, no rubs, no gallops  Respiratory: Clear to auscultation bilaterally, no wheezing, no crackles, no rhonchi  Abdomen: Soft, non tender, non distended, bowel sounds present, no guarding  Extremities: pulses DP and PT palpable bilaterally  Data Reviewed: Basic Metabolic Panel:  Recent Labs Lab 09/08/15 1941 09/09/15 0455  NA 139 134*  K 4.2 4.1  CL 105 101  CO2 24 24  GLUCOSE 135* 175*  BUN 19 18  CREATININE 0.77 0.85  CALCIUM 9.2 8.5*   CBC:  Recent Labs Lab  09/08/15 1941 09/09/15 0455 09/10/15 0900  WBC 12.6* 8.1 8.3  NEUTROABS 10.7*  --   --   HGB 11.2* 9.2* 7.6*  HCT 35.0* 29.0* 22.9*  MCV 89.7 89.0 87.7  PLT 214 197 168   Scheduled Meds: . aspirin EC  325 mg Oral Q breakfast  . cefTRIAXone (ROCEPHIN) IVPB 1 gram/50 mL D5W  1 g Intravenous QHS  . docusate sodium  100 mg Oral BID  . meperidine (DEMEROL) injection  6.25-12.5 mg Intravenous Once  . senna  1 tablet Oral BID   Continuous Infusions:

## 2015-09-10 NOTE — Evaluation (Signed)
Physical Therapy Evaluation Patient Details Name: Shelly Glenn MRN: [id number] DOB: 06-18-1921 Today's Date: 09/10/2015   History of Present Illness  81 yo female s/p ORIF R hip 09/09/15.   Clinical Impression  On eval, pt required Max assist +2 for mobility. Sat EOB for ~3-4 minutes with Min guard assist. Pt not yet ready to stand/ambulate. Hgb 7.6 and pt possibly to receive transfusion. Will continue to follow and progress activity as able.     Follow Up Recommendations SNF    Equipment Recommendations  Rolling walker with 5" wheels    Recommendations for Other Services       Precautions / Restrictions Precautions Precautions: Fall Restrictions Weight Bearing Restrictions: Yes RLE Weight Bearing: Partial weight bearing RLE Partial Weight Bearing Percentage or Pounds: 50%      Mobility  Bed Mobility Overal bed mobility: Needs Assistance Bed Mobility: Supine to Sit;Sit to Supine     Supine to sit: Max assist;HOB elevated;+2 for physical assistance Sit to supine: Max assist;HOB elevated   General bed mobility comments: Assist for trunk and bil LEs. Utilized bedpad for scooting, positioning. Increased time. Max multimodal cueing for technique and for pt to participate fully.   Transfers                 General transfer comment: NT-pt not yet able and hgb 7.6-awaiting transfusion  Ambulation/Gait                Stairs            Wheelchair Mobility    Modified Rankin (Stroke Patients Only)       Balance Overall balance assessment: Needs assistance Sitting-balance support: Bilateral upper extremity supported;Feet supported Sitting balance-Leahy Scale: Fair                                       Pertinent Vitals/Pain Pain Assessment: Faces Faces Pain Scale: Hurts whole lot Pain Location: R LE, neck, back Pain Descriptors / Indicators: Sore;Sharp;Aching Pain Intervention(s): Monitored during session;Repositioned;Limited  activity within patient's tolerance    Home Living   Living Arrangements: Alone   Type of Home: Independent living facility Home Access: Level entry     Home Layout: One level Home Equipment: None      Prior Function Level of Independence: Independent         Comments: very independent. cooks.      Hand Dominance        Extremity/Trunk Assessment   Upper Extremity Assessment: Generalized weakness           Lower Extremity Assessment: Generalized weakness      Cervical / Trunk Assessment: Kyphotic  Communication   Communication: No difficulties  Cognition Arousal/Alertness: Lethargic Behavior During Therapy: WFL for tasks assessed/performed Overall Cognitive Status: Within Functional Limits for tasks assessed                      General Comments      Exercises        Assessment/Plan    PT Assessment Patient needs continued PT services  PT Diagnosis Difficulty walking;Generalized weakness;Acute pain   PT Problem List Decreased strength;Decreased range of motion;Decreased activity tolerance;Decreased balance;Decreased mobility;Decreased knowledge of use of DME;Pain;Decreased knowledge of precautions  PT Treatment Interventions DME instruction;Gait training;Functional mobility training;Therapeutic activities;Patient/family education;Balance training;Therapeutic exercise   PT Goals (Current goals can be found in the Care Plan section) Acute  Rehab PT Goals Patient Stated Goal: less pain.  PT Goal Formulation: With patient/family Time For Goal Achievement: 09/24/15 Potential to Achieve Goals: Fair    Frequency Min 3X/week   Barriers to discharge        Co-evaluation               End of Session   Activity Tolerance: Patient limited by fatigue;Patient limited by pain Patient left: in bed;with call bell/phone within reach;with family/visitor present           Time: 0950-1015 PT Time Calculation (min) (ACUTE ONLY): 25  min   Charges:   PT Evaluation $Initial PT Evaluation Tier I: 1 Procedure PT Treatments $Therapeutic Activity: 8-22 mins   PT G Codes:        Rebeca Alert, MPT Pager: [phone]

## 2015-09-10 NOTE — Care Management Note (Signed)
Case Management Note  Patient Details  Name: Brieanne Mignone MRN: [id number] Date of Birth: 1921-02-27  Subjective/Objective:                   INTRAMEDULLARY (IM) NAIL INTERTROCHANTRIC (Right) Action/Plan:  Discharge planning Expected Discharge Date:                  Expected Discharge Plan:  Skilled Nursing Facility  In-House Referral:     Discharge planning Services  CM Consult  Post Acute Care Choice:    Choice offered to:     DME Arranged:    DME Agency:     HH Arranged:    HH Agency:     Status of Service:  Completed, signed off  Medicare Important Message Given:  Yes-second notification given Date Medicare IM Given:    Medicare IM give by:    Date Additional Medicare IM Given:    Additional Medicare Important Message give by:     If discussed at Long Length of Stay Meetings, dates discussed:    Additional Comments: CM notes pt to go to SNF for rehab; CSW aware and arranging.  No other CM needs were communicated. Yves Dill, RN 09/10/2015, 1:54 PM

## 2015-09-10 NOTE — Care Management Important Message (Signed)
Important Message  Patient Details  Name: Shelly Glenn MRN: [id number] Date of Birth: 01-20-1921   Medicare Important Message Given:  Yes-second notification given    [name] 09/10/2015, 1:50 PMImportant Message  Patient Details  Name: Shelly Glenn MRN: [id number] Date of Birth: [birth year]   Medicare Important Message Given:  Yes-second notification given    [name] 09/10/2015, 1:49 PM

## 2015-09-10 NOTE — Progress Notes (Signed)
Subjective: 1 Day Post-Op Procedure(s) (LRB): INTRAMEDULLARY (IM) NAIL INTERTROCHANTRIC (Right) Patient reports pain as moderate.  Acute blood loss anemia from her hip fracture and surgery.  Vitals stable.  Family at the bedside.  Objective: Vital signs in last 24 hours: Temp:  [97.3 F (36.3 C)-98.9 F (37.2 C)] 98.9 F (37.2 C) (10/04 1027) Pulse Rate:  [63-84] 83 (10/04 0633) Resp:  [16-20] 16 (10/04 2536) BP: (105-145)/(36-91) 111/36 mmHg (10/04 0633) SpO2:  [95 %-100 %] 97 % (10/04 6440)  Intake/Output from previous day: 10/03 0701 - 10/04 0700 In: 1667.5 [P.O.:60; I.V.:1607.5] Out: 1275 [Urine:1175; Blood:100] Intake/Output this shift:     Recent Labs  09/08/15 1941 09/09/15 0455  HGB 11.2* 9.2*    Recent Labs  09/08/15 1941 09/09/15 0455  WBC 12.6* 8.1  RBC 3.90 3.26*  HCT 35.0* 29.0*  PLT 214 197    Recent Labs  09/08/15 1941 09/09/15 0455  NA 139 134*  K 4.2 4.1  CL 105 101  CO2 24 24  BUN 19 18  CREATININE 0.77 0.85  GLUCOSE 135* 175*  CALCIUM 9.2 8.5*    Recent Labs  09/08/15 1941  INR 1.10    Sensation intact distally Intact pulses distally Dorsiflexion/Plantar flexion intact Incision: scant drainage  Assessment/Plan: 1 Day Post-Op Procedure(s) (LRB): INTRAMEDULLARY (IM) NAIL INTERTROCHANTRIC (Right) Up with therapy - 50% weight only right hip Will likely need SNF placement  Monnie Gudgel Y 09/10/2015, 7:25 AM

## 2015-09-10 NOTE — Op Note (Signed)
NAMEHARVEST, DEIST NO.:  [id number]  MEDICAL RECORD NO.:  [id number]  LOCATION:  1616                         FACILITY:  [facility]  PHYSICIAN:  Vanita Panda. Magnus Ivan, M.D.DATE OF BIRTH:  [birth year]  DATE OF PROCEDURE:  09/09/2015 DATE OF DISCHARGE:                              OPERATIVE REPORT   PREOPERATIVE DIAGNOSIS:  Displaced and comminuted right intertrochanteric femur/hip fracture.  POSTOPERATIVE DIAGNOSIS:  Displaced and comminuted right intertrochanteric femur/hip fracture.  PROCEDURE:  Open reduction and internal fixation of right intertrochanteric hip fracture using intramedullary rod and hip screw.  IMPLANTS:  Biomet 9 x 340 intramedullary nail with 100 mm lag screw.  SURGEON:  Vanita Panda. Magnus Ivan, M.D.  ANESTHESIA:  Spinal.  ANTIBIOTICS:  2 g of IV Ancef.  BLOOD LOSS:  Less than 200 mL.  COMPLICATIONS:  None.  INDICATIONS:  Shelly Glenn is a 81 year old female, who ambulates with a walker and stays in assisted living facility.  She had a mechanical fall, which was accidental fall when she tripped over an uneven concrete yesterday.  She landed on her right hip and was seen at the [facility] Emergency Room and found to have right intertrochanteric hip fracture. She was graciously admitted to the Medicine Service for medical optimization and now presents for surgery.  She and her family understands fully the risks and benefits of surgery and understands why we are proceeding.  PROCEDURE DESCRIPTION:  After informed consent was obtained, appropriate right hip was marked.  She was brought to the operating room and spinal anesthesia was obtained while she was on her stretcher.  Next, she was placed supine on the HANA fracture table with the right leg in an in- line skeletal traction boot and a perineal post in place and left hip abducted and placed in a well leg holder with appropriate padding behind the popliteal area.  We then pulled  traction internally to the leg and assessed her under fluoroscopy and it maintained an adequate reduction. She had significant comminution greater trochanteric area.  We then prepped and draped the right hip with DuraPrep and sterile drapes.  Time- out was called and she was identified as correct patient, correct right hip.  I then made an incision proximal to the greater trochanter and carried this down to the tip of the greater trochanter.  Under direct visualization and fluoroscopy, we placed temporary guide pin in antegrade fashion from the level of the greater trochanter down to the lesser trochanter.  We then used initiating reamer to open up the femoral canal.  We had pre-chosen our size nail for a 9 x 340 and so we placed this nail down the femoral canal without needing to ream.  Using the outrigger guide from the nail, we then made a separate lateral incision and placed the temporary guide pin from the lateral cortex of the femur traversing the fracture of the femoral neck in a center-center position in the femoral head.  We then took a measurement of this and chose a 100 mm lag screw which we placed without difficulty.  We then let the traction off the bed and compressed the fracture as well.  We then locked the  rod from the top and removed the outrigger guide.  We assessed the leg through internal and external rotation and it was stable.  We then copiously irrigated both small incisions with normal saline solution, closing the deep tissue with 0-Vicryl followed by 2-0 Vicryl in the subcutaneous tissue, interrupted staples on the skin. Well-padded sterile dressing was applied.  She was taken off the fracture table and taken to the recovery room in stable condition.  All final counts were correct.  There were no complications noted.     Vanita Panda. Magnus Ivan, M.D.     CYB/MEDQ  D:  09/09/2015  T:  09/10/2015  Job:  [id number]

## 2015-09-11 ENCOUNTER — Encounter (HOSPITAL_COMMUNITY): Payer: Self-pay | Admitting: Internal Medicine

## 2015-09-11 DIAGNOSIS — S72141D Displaced intertrochanteric fracture of right femur, subsequent encounter for closed fracture with routine healing: Secondary | ICD-10-CM

## 2015-09-11 DIAGNOSIS — R946 Abnormal results of thyroid function studies: Secondary | ICD-10-CM

## 2015-09-11 DIAGNOSIS — E440 Moderate protein-calorie malnutrition: Secondary | ICD-10-CM | POA: Diagnosis present

## 2015-09-11 DIAGNOSIS — R7989 Other specified abnormal findings of blood chemistry: Secondary | ICD-10-CM | POA: Diagnosis present

## 2015-09-11 DIAGNOSIS — E538 Deficiency of other specified B group vitamins: Secondary | ICD-10-CM

## 2015-09-11 HISTORY — DX: Deficiency of other specified B group vitamins: E53.8

## 2015-09-11 LAB — TYPE AND SCREEN
ABO/RH(D): A POS
ANTIBODY SCREEN: NEGATIVE
UNIT DIVISION: 0
UNIT DIVISION: 0

## 2015-09-11 LAB — BASIC METABOLIC PANEL
ANION GAP: 7 (ref 5–15)
BUN: 16 mg/dL (ref 6–20)
CHLORIDE: 104 mmol/L (ref 101–111)
CO2: 26 mmol/L (ref 22–32)
CREATININE: 0.72 mg/dL (ref 0.44–1.00)
Calcium: 8.5 mg/dL — ABNORMAL LOW (ref 8.9–10.3)
GFR calc non Af Amer: >60 mL/min (ref >60)
Glucose, Bld: 121 mg/dL — ABNORMAL HIGH (ref 65–99)
Potassium: 4 mmol/L (ref 3.5–5.1)
SODIUM: 137 mmol/L (ref 135–145)

## 2015-09-11 LAB — CBC
HEMATOCRIT: 29.4 % — AB (ref 36.0–46.0)
HEMOGLOBIN: 9.9 g/dL — AB (ref 12.0–15.0)
MCH: 29.2 pg (ref 26.0–34.0)
MCHC: 33.7 g/dL (ref 30.0–36.0)
MCV: 86.7 fL (ref 78.0–100.0)
Platelets: 136 10*3/uL — ABNORMAL LOW (ref 150–400)
RBC: 3.39 MIL/uL — AB (ref 3.87–5.11)
RDW: 14.6 % (ref 11.5–15.5)
WBC: 8 10*3/uL (ref 4.0–10.5)

## 2015-09-11 MED ORDER — ENSURE ENLIVE PO LIQD
237.0000 mL | Freq: Two times a day (BID) | ORAL | Status: DC
  Administered 2015-09-11 – 2015-09-12 (×2): 237 mL via ORAL

## 2015-09-11 MED ORDER — CYANOCOBALAMIN 1000 MCG/ML IJ SOLN
1000.0000 ug | Freq: Once | INTRAMUSCULAR | Status: AC
  Administered 2015-09-11: 1000 ug via INTRAMUSCULAR
  Filled 2015-09-11: qty 1

## 2015-09-11 NOTE — Progress Notes (Signed)
Physical Therapy Treatment Patient Details Name: Shelly Glenn MRN: [id number] DOB: 05-01-1921 Today's Date: 09/11/2015    History of Present Illness 81 yo female s/p ORIF R hip 09/09/15.     PT Comments    Progressing slowly with mobility. Will need SNF  Follow Up Recommendations  SNF     Equipment Recommendations  Rolling walker with 5" wheels (youth)    Recommendations for Other Services       Precautions / Restrictions Precautions Precautions: Fall Restrictions Weight Bearing Restrictions: Yes RLE Weight Bearing: Partial weight bearing RLE Partial Weight Bearing Percentage or Pounds: 50%    Mobility  Bed Mobility Overal bed mobility: Needs Assistance Bed Mobility: Sit to Supine     Supine to sit: Max assist;+2 for physical assistance;+2 for safety/equipment;HOB elevated Sit to supine: Max assist;+2 for physical assistance;+2 for safety/equipment   General bed mobility comments: Assist for trunk and bil LEs. Utilized bedpad for scooting, positioning. Increased time. Max multimodal cueing for technique and for pt to participate fully. Pt repeats "I can't"  Transfers Overall transfer level: Needs assistance Equipment used: Rolling walker (2 wheeled) Transfers: Sit to/from Stand Sit to Stand: Mod assist;+2 physical assistance;+2 safety/equipment Stand pivot transfers: Mod assist;+2 physical assistance;+2 safety/equipment       General transfer comment: Assist to rise, stabilize, control descent. Multimodal cues for safety, technique, adherence to PWB status, hand/LE placement. Max encouragement needed as well.  Ambulation/Gait Ambulation/Gait assistance: Mod assist;+2 physical assistance;+2 safety/equipment Ambulation Distance (Feet): 4 Feet Assistive device: Rolling walker (2 wheeled) Gait Pattern/deviations: Trunk flexed;Antalgic;Step-to pattern     General Gait Details: Assist to stabilize/support pt, maneuver with RW, advance R LE. Multimodal cues for  safety, posture, sequence, proper use of RW. Limited by pain, fatigue.    Stairs            Wheelchair Mobility    Modified Rankin (Stroke Patients Only)       Balance   Sitting-balance support: Feet supported Sitting balance-Leahy Scale: Fair     Standing balance support: Bilateral upper extremity supported;During functional activity Standing balance-Leahy Scale: Poor                      Cognition Arousal/Alertness: Awake/alert Behavior During Therapy: WFL for tasks assessed/performed Overall Cognitive Status: Within Functional Limits for tasks assessed                      Exercises      General Comments        Pertinent Vitals/Pain Pain Assessment: Faces Faces Pain Scale: Hurts even more Pain Location: R LE Pain Descriptors / Indicators: Aching;Sore;Sharp Pain Intervention(s): Monitored during session;Repositioned    Home Living                      Prior Function            PT Goals (current goals can now be found in the care plan section) Progress towards PT goals: Progressing toward goals    Frequency  Min 3X/week    PT Plan Current plan remains appropriate    Co-evaluation             End of Session Equipment Utilized During Treatment: Gait belt Activity Tolerance: Patient limited by pain Patient left: in bed;with call bell/phone within reach;with bed alarm set;with family/visitor present     Time: 4098-1191 PT Time Calculation (min) (ACUTE ONLY): 15 min  Charges:  $Gait Training: 8-22 mins $  Therapeutic Activity: 8-22 mins                    G Codes:      Weston Anna, MPT Pager: [phone]

## 2015-09-11 NOTE — Progress Notes (Signed)
Pt has a SNF bed at [facility] when stable for d/c.   Cori Razor LCSW [phone]

## 2015-09-11 NOTE — Progress Notes (Signed)
Physical Therapy Treatment Patient Details Name: Shelly Glenn MRN: [id number] DOB: [birth year] Today's Date: 09/11/2015    History of Present Illness 81 yo female s/p ORIF R hip 09/09/15.     PT Comments    Progressing slowly with mobility. Significant pain with mobility. Will need SNF.   Follow Up Recommendations  SNF     Equipment Recommendations  Rolling walker with 5" wheels (youth)    Recommendations for Other Services       Precautions / Restrictions Precautions Precautions: Fall Restrictions Weight Bearing Restrictions: Yes RLE Weight Bearing: Partial weight bearing RLE Partial Weight Bearing Percentage or Pounds: 50%    Mobility  Bed Mobility Overal bed mobility: Needs Assistance Bed Mobility: Supine to Sit     Supine to sit: Max assist;+2 for physical assistance;+2 for safety/equipment;HOB elevated     General bed mobility comments: Assist for trunk and bil LEs. Utilized bedpad for scooting, positioning. Increased time. Max multimodal cueing for technique and for pt to participate fully. Pt repeats "I can't"  Transfers Overall transfer level: Needs assistance Equipment used: Rolling walker (2 wheeled) Transfers: Sit to/from [employer] Sit to Stand: Max assist;+2 physical assistance;+2 safety/equipment;From elevated surface         General transfer comment: Assist to rise, stabilize, control descent. Multimodal cues for safety, technique, adherence to PWB status, hand/LE placement. Max encouragement needed as well.  Ambulation/Gait Ambulation/Gait assistance: Mod assist;+2 physical assistance;+2 safety/equipment Ambulation Distance (Feet): 4 Feet Assistive device: Rolling walker (2 wheeled) Gait Pattern/deviations: Trunk flexed;Antalgic;Step-to pattern     General Gait Details: Assist to stabilize/support pt, maneuver with RW, advance R LE. Multimodal cues for safety, posture, sequence, proper use of RW. Limited by pain, fatigue.     Stairs            Wheelchair Mobility    Modified Rankin (Stroke Patients Only)       Balance   Sitting-balance support: Feet supported Sitting balance-Leahy Scale: Fair     Standing balance support: Bilateral upper extremity supported;During functional activity Standing balance-Leahy Scale: Poor                      Cognition Arousal/Alertness: Awake/alert Behavior During Therapy: WFL for tasks assessed/performed Overall Cognitive Status: Within Functional Limits for tasks assessed                      Exercises      General Comments        Pertinent Vitals/Pain Pain Assessment: Faces Faces Pain Scale: Hurts whole lot Pain Location: R LE Pain Descriptors / Indicators: Sore;Aching;Sharp Pain Intervention(s): Limited activity within patient's tolerance;Repositioned    Home Living                      Prior Function            PT Goals (current goals can now be found in the care plan section) Progress towards PT goals: Progressing toward goals ( slowly)    Frequency  Min 3X/week    PT Plan Current plan remains appropriate    Co-evaluation             End of Session Equipment Utilized During Treatment: Gait belt Activity Tolerance: Patient limited by fatigue;Patient limited by pain Patient left: in chair;with call bell/phone within reach;with family/visitor present     Time: 1030-1054 PT Time Calculation (min) (ACUTE ONLY): 24 min  Charges:  $Gait Training: 8-22 mins $Therapeutic  Activity: 8-22 mins                    G Codes:      Rebeca Alert, MPT Pager: [phone]

## 2015-09-11 NOTE — Plan of Care (Signed)
Problem: Phase III Progression Outcomes Goal: Anticoagulant follow-up in place Outcome: Not Applicable Date Met:  67/20/91 ASA VTE, no f/u needed.

## 2015-09-11 NOTE — Progress Notes (Signed)
Subjective: 2 Days Post-Op Procedure(s) (LRB): INTRAMEDULLARY (IM) NAIL INTERTROCHANTRIC (Right) Patient reports pain as moderate.  Awake and alert.  Objective: Vital signs in last 24 hours: Temp:  [97.7 F (36.5 C)-100 F (37.8 C)] 100 F (37.8 C) (10/05 0552) Pulse Rate:  [71-85] 79 (10/05 0552) Resp:  [16-20] 16 (10/05 0552) BP: (95-118)/(40-49) 118/49 mmHg (10/05 0552) SpO2:  [98 %-100 %] 98 % (10/05 0552)  Intake/Output from previous day: 10/04 0701 - 10/05 0700 In: 774 [P.O.:120; Blood:654] Out: 675 [Urine:675] Intake/Output this shift:     Recent Labs  09/08/15 1941 09/09/15 0455 09/10/15 0900 09/11/15 0510  HGB 11.2* 9.2* 7.6* 9.9*    Recent Labs  09/10/15 0900 09/11/15 0510  WBC 8.3 8.0  RBC 2.61*  2.61* 3.39*  HCT 22.9* 29.4*  PLT 168 136*    Recent Labs  09/10/15 0900 09/11/15 0510  NA 136 137  K 3.8 4.0  CL 103 104  CO2 25 26  BUN 15 16  CREATININE 0.74 0.72  GLUCOSE 126* 121*  CALCIUM 8.4* 8.5*    Recent Labs  09/08/15 1941  INR 1.10   Right lower extremity:  Sensation intact distally Intact pulses distally Dorsiflexion/Plantar flexion intact Incision: dressing C/D/I Compartment soft  Assessment/Plan: 2 Days Post-Op Procedure(s) (LRB): INTRAMEDULLARY (IM) NAIL INTERTROCHANTRIC (Right) Up with therapy 50% WB right leg HgB improved s/p blood transfusion vitals stable CLARK, GILBERT 09/11/2015, 7:55 AM

## 2015-09-11 NOTE — Progress Notes (Signed)
Initial Nutrition Assessment  DOCUMENTATION CODES:   Non-severe (moderate) malnutrition in context of chronic illness  INTERVENTION:   -Provide Ensure Enlive po BID, each supplement provides 350 kcal and 20 grams of protein -RD ordered lunch for patient -Encourage PO intake -RD to continue to monitor  NUTRITION DIAGNOSIS:   Inadequate oral intake related to poor appetite as evidenced by meal completion < 25%.  GOAL:   Patient will meet greater than or equal to 90% of their needs  MONITOR:   PO intake, Supplement acceptance, Labs, Weight trends, Skin, I & O's  REASON FOR ASSESSMENT:   Consult Assessment of nutrition requirement/status  ASSESSMENT:   81 y.o. female presented to [facility] ED after an episode of fall while walking on an uneven concrete. She fell on right side and has sustained right hip fracture.  S/p Procedure(s) (LRB): INTRAMEDULLARY (IM) NAIL INTERTROCHANTRIC (Right)  Pt in room with family at bedside. Per niece, pt eats well PTA. Now patient states she has very little appetite. Per niece, pt only ate 2 bites of a muffin this morning. Pt did not seem interested in eating lunch but family insisted she eat. RD ordered soup and pudding for patient to try at lunchtime. Pt is willing to try Ensure supplements but family stated that they don't think she will drink them. RD to order.  Pt's weight is stable.   Nutrition-Focused physical exam completed. Findings are mild fat depletion, mild muscle depletion, and no edema.   Labs reviewed.  Diet Order:  Diet regular Room service appropriate?: Yes; Fluid consistency:: Thin  Skin:  Wound (see comment) (hip incision)  Last BM:  10/2  Height:   Ht Readings from Last 1 Encounters:  09/08/15  (1.651 m)    Weight:   Wt Readings from Last 1 Encounters:  09/08/15 130 lb (58.968 kg)    Ideal Body Weight:  56.8 kg  BMI:  Body mass index is 21.63 kg/(m^2).  Estimated Nutritional Needs:   Kcal:   1450-1650  Protein:  80-90g  Fluid:  1.7L/day  EDUCATION NEEDS:   No education needs identified at this time  Tilda Franco, MS, RD, LDN Pager: [phone] After Hours Pager: [phone]

## 2015-09-11 NOTE — Progress Notes (Signed)
OT  Note  Patient Details Name: Elainah Rhyne MRN: [id number] DOB: 03/25/1921   Cancelled Treatment:   Noted plans for SNF.  Will defer OT eval to SNF    Nieko Clarin, [place] Kung 09/11/2015, 11:30 AM

## 2015-09-11 NOTE — Progress Notes (Signed)
Progress Note   Izabela Ow WUJ:[id number] DOB: 07-Jun-1921 DOA: 09/08/2015 PCP: No primary care provider on file.   Brief Narrative:   Shelly Glenn is an 81 y.o. female with a PMH of thyroid disease who was admitted 09/08/15 with a right hip fracture after sustaining a fall while walking on an uneven surface.  Assessment/Plan:   Principal Problem:  Closed and displaced intertrochanteric fracture of right hip (HCC) - s/p ORIF, 09/09/15. - Stable after some post operative delirium 09/10/15. - PT evaluation done 09/11/15, SNF recommended. - Continue Aspirin 325 mg PO QD for DVT prophylaxis.   Active Problems:   Low TSH - Likely sick euthyroid syndrome.  No symptoms associated with hyperthyroidism.  Repeat TSH in 4-6 weeks.    Moderate malnutrition - Seen and evaluated by dietician 09/11/15, continue nutritional supplements.     Acute on chronic blood loss anemia, post op related / B12 deficiency - Noted to have a drop in Hg from 11 --> 7.6  Hgb 9.9 after being given 2 units of PRBCs 09/10/15. - Will start on B12 supplementation given low B12 levels.  Ferritin 344.  Iron low.     Acute encephalopathy - Developed acutely postoperatively.  Now resolved.  Likely medication related.   E. Coli UTI (lower urinary tract infection) - Cultures growing E Coli, continue rocephin.   - Follow up on final urine culture sensitivities.   Leukocytosis - This is likely multifactorial and secondary to hip fracture and UTI.  Resolved.   High blood pressure associated with pain - SBP in 170's on admission and likely exacerbated by pain. - BP stable with better pain control.   Acute hyponatremia - Pre renal in etiology, resolved with IVF. - IVF have been provided and Na is now WNL.     DVT prophylaxis  - SCD's, aspirin 325 mg PO QD   Code Status: DNR Family Communication: plan of care discussed with the patient and niece at bedside  Disposition Plan: not ready for d/c yet but  will need SNF  IV Access:    Peripheral IV   Procedures and diagnostic studies:   Dg Chest 1 View  09/08/2015   CLINICAL DATA:  Status post fall, with right hip fracture. Preoperative chest radiograph. Initial encounter.  EXAM: CHEST 1 VIEW  COMPARISON:  None.  FINDINGS: The lungs are well-aerated and clear. There is no evidence of focal opacification, pleural effusion or pneumothorax.  The cardiomediastinal silhouette is borderline normal in size. Diffuse calcification is noted along the descending thoracic aorta. No acute osseous abnormalities are seen. Degenerative change is noted at the acromioclavicular joints bilaterally.  IMPRESSION: No acute cardiopulmonary process seen. No displaced rib fractures identified.   Electronically Signed   By: Roanna Raider M.D.   On: 09/08/2015 22:24   Dg C-arm 1-60 Min-no Report  09/09/2015   CLINICAL DATA: surgery   C-ARM 1-60 MINUTES  Fluoroscopy was utilized by the requesting physician.  No radiographic  interpretation.    Dg Hip Unilat  With Pelvis 2-3 Views Right  09/08/2015   CLINICAL DATA:  [facility] living facility. Fell on RIGHT side. Pain.  EXAM: DG HIP (WITH OR WITHOUT PELVIS) 2-3V RIGHT  COMPARISON:  None.  FINDINGS: There is a comminuted intertrochanteric fracture with displacement of fragments. Slight varus angulation. No pelvic fracture. Osteopenia. Degenerative change lumbar spine.  IMPRESSION: Comminuted intertrochanteric fracture with displacement of the fracture fragments. Slight varus angulation. No pelvic fracture.   Electronically Signed  By: Elsie Stain M.D.   On: 09/08/2015 19:01   Dg Femur, Min 2 Views Right  09/09/2015   CLINICAL DATA:  Right hip fracture fixation.  Initial encounter.  EXAM: RIGHT FEMUR 2 VIEWS; DG C-ARM 1-60 MIN-NO REPORT  COMPARISON:  09/08/2015.  FINDINGS: Four spot fluoroscopic images demonstrate open reduction and internal fixation of the comminuted intertrochanteric/ subtrochanteric right femur  fracture with a dynamic screw and intramedullary rod. The hardware appears well positioned. No interlocking screws or complications identified.  IMPRESSION: Improved alignment of proximal femur fracture post ORIF.   Electronically Signed   By: Carey Bullocks M.D.   On: 09/09/2015 19:43     Medical Consultants:    Orthopedic Surgery: Kathryne Hitch, MD  Anti-Infectives:   Anti-infectives    Start     Dose/Rate Route Frequency Ordered Stop   09/09/15 2045  ceFAZolin (ANCEF) IVPB 2 g/50 mL premix  Status:  Discontinued     2 g 100 mL/hr over 30 Minutes Intravenous Every 6 hours 09/09/15 2037 09/09/15 2114   09/09/15 1730  ceFAZolin (ANCEF) IVPB 2 g/50 mL premix     2 g 100 mL/hr over 30 Minutes Intravenous  Once 09/09/15 1916 09/09/15 1755   09/08/15 2245  cefTRIAXone (ROCEPHIN) 1 g in dextrose 5 % 50 mL IVPB     1 g 100 mL/hr over 30 Minutes Intravenous Daily at bedtime 09/08/15 2233        Subjective:   Stpehanie Montroy feels well, but reports she has not yet been out of bed, and that her appetite remains poor.  She had some nausea earlier today, no vomiting.  Has some hip pain on the right with movement.  Objective:    Filed Vitals:   09/10/15 1848 09/10/15 2115 09/11/15 0552 09/11/15 1418  BP: 96/40 107/45 118/49 104/54  Pulse: 72 75 79 75  Temp: 97.7 F (36.5 C) 99 F (37.2 C) 100 F (37.8 C) 98.4 F (36.9 C)  TempSrc: Oral Oral Oral Oral  Resp: Height:      Weight:      SpO2: 100% 99% 98% 95%    Intake/Output Summary (Last 24 hours) at 09/11/15 1441 Last data filed at 09/11/15 1354  Gross per 24 hour  Intake   1014 ml  Output    825 ml  Net    189 ml   Filed Weights   09/08/15 1756  Weight: 58.968 kg (130 lb)    Exam: Gen:  NAD Cardiovascular:  RRR, No M/R/G Respiratory:  Lungs CTAB Gastrointestinal:  Abdomen soft, NT/ND, + BS Extremities:  No C/E/C   Data Reviewed:    Labs: Basic Metabolic Panel:  Recent Labs Lab  09/08/15 1941 09/09/15 0455 09/10/15 0900 09/11/15 0510  NA 139 134* 136 137  K 4.2 4.1 3.8 4.0  CL 105 101 103 104  CO2 GLUCOSE 135* 175* 126* 121*  BUN CREATININE 0.77 0.85 0.74 0.72  CALCIUM 9.2 8.5* 8.4* 8.5*   GFR Estimated Creatinine Clearance: 41.2 mL/min (by C-G formula based on Cr of 0.72). Liver Function Tests: No results for input(s): AST, ALT, ALKPHOS, BILITOT, PROT, ALBUMIN in the last 168 hours. No results for input(s): LIPASE, AMYLASE in the last 168 hours. No results for input(s): AMMONIA in the last 168 hours. Coagulation profile  Recent Labs Lab 09/08/15 1941  INR 1.10    CBC:  Recent Labs Lab 09/08/15 1941  09/09/15 0455 09/10/15 0900 09/11/15 0510  WBC 12.6* 8.1 8.3 8.0  NEUTROABS 10.7*  --   --   --   HGB 11.2* 9.2* 7.6* 9.9*  HCT 35.0* 29.0* 22.9* 29.4*  MCV 89.7 89.0 87.7 86.7  PLT 214 197 168 136*   Thyroid function studies:  Recent Labs  09/09/15 0455  TSH 0.176*   Anemia work up:  Recent Labs  09/10/15 0900  VITAMINB12 99*  FOLATE 10.1  FERRITIN 344*  TIBC 183*  IRON 22*  RETICCTPCT 1.4   Sepsis Labs:  Recent Labs Lab 09/08/15 1941 09/09/15 0455 09/10/15 0900 09/11/15 0510  WBC 12.6* 8.1 8.3 8.0   Microbiology Recent Results (from the past 240 hour(s))  Urine culture     Status: None (Preliminary result)   Collection Time: 09/08/15  9:57 PM  Result Value Ref Range Status   Specimen Description URINE, CLEAN CATCH  Final   Special Requests NONE  Final   Culture   Final    >=100,000 COLONIES/mL ESCHERICHIA COLI Performed at [facility]    Report Status PENDING  Incomplete  Surgical pcr screen     Status: None   Collection Time: 09/09/15  2:00 PM  Result Value Ref Range Status   MRSA, PCR NEGATIVE NEGATIVE Final   Staphylococcus aureus NEGATIVE NEGATIVE Final    Comment:        The Xpert SA Assay (FDA approved for NASAL specimens in patients over 68 years of age), is one  component of a comprehensive surveillance program.  Test performance has been validated by [facility] for patients greater than or equal to 51 year old. It is not intended to diagnose infection nor to guide or monitor treatment.      Medications:   . aspirin EC  325 mg Oral Q breakfast  . cefTRIAXone (ROCEPHIN) IVPB 1 gram/50 mL D5W  1 g Intravenous QHS  . cyanocobalamin  1,000 mcg Intramuscular Once  . docusate sodium  100 mg Oral BID  . feeding supplement (ENSURE ENLIVE)  237 mL Oral BID BM  . senna  1 tablet Oral BID   Continuous Infusions:   Time spent: 25 minutes.   LOS: 3 days   Zerek Litsey  [facility] Pager [phone]. If unable to reach me by pager, please call my cell phone at [phone].  *Please refer to [email or web address], password [email or web address] to get updated schedule on who will round on this patient, as hospitalists switch teams weekly. If 7PM-7AM, please contact night-coverage at [email or web address], password [email or web address] for any overnight needs.  09/11/2015, 2:41 PM

## 2015-09-12 DIAGNOSIS — E440 Moderate protein-calorie malnutrition: Secondary | ICD-10-CM

## 2015-09-12 LAB — URINE CULTURE: Culture: >=100000

## 2015-09-12 MED ORDER — POLYETHYLENE GLYCOL 3350 17 G PO PACK: 17.0000 g | PACK | Freq: Every day | ORAL | Status: AC | PRN

## 2015-09-12 MED ORDER — BISACODYL 10 MG RE SUPP: 10.0000 mg | Freq: Every day | RECTAL | Status: AC | PRN

## 2015-09-12 MED ORDER — SENNA 8.6 MG PO TABS: 1.0000 | ORAL_TABLET | Freq: Two times a day (BID) | ORAL | Status: AC

## 2015-09-12 MED ORDER — CIPROFLOXACIN HCL 500 MG PO TABS: 500.0000 mg | ORAL_TABLET | Freq: Two times a day (BID) | ORAL | Status: AC

## 2015-09-12 MED ORDER — DOCUSATE SODIUM 100 MG PO CAPS: 100.0000 mg | ORAL_CAPSULE | Freq: Two times a day (BID) | ORAL | Status: AC

## 2015-09-12 NOTE — Discharge Summary (Addendum)
Physician Discharge Summary  Athziry Millican XBJ:[id number] DOB: 08/24/1921 DOA: 09/08/2015  PCP: No primary care provider on file.  Admit date: 09/08/2015 Discharge date: 09/12/2015  Recommendations for Outpatient Follow-up:  1. Pt will need to follow up with PCP in 2-3 weeks post discharge 2. Please obtain BMP to evaluate electrolytes and kidney function 3. Please also check CBC to evaluate Hg and Hct levels 4. Continue Cipro for 3 more days post discharge  5. Please note that pt was started on Aspirin 325 mg PO QD for DVT prophylaxis 6. Please ensure that pt has appointment follow up with orthopedic specialist Dr. Magnus Ivan  7. Pt will need repeat TSH in 4-6 weeks   Discharge Diagnoses:  Principal Problem:   Closed intertrochanteric fracture of right hip (HCC) Active Problems:   UTI (lower urinary tract infection)   Acute blood loss anemia, post op related imposed on IDA  Discharge Condition: Stable  Diet recommendation: Heart healthy diet discussed in details    Brief Narrative:   81 y.o. female with a PMH of thyroid disease who was admitted 09/08/15 with a right hip fracture after sustaining a fall while walking on an uneven surface.  Assessment/Plan:   Principal Problem:  Closed and displaced intertrochanteric fracture of right hip (HCC) - s/p ORIF, 09/09/15. - Stable after some post operative delirium 09/10/15. - PT evaluation done 09/11/15, SNF recommended and pt stable for d/c today  - Continue Aspirin 325 mg PO QD for DVT prophylaxis.   Active Problems:  Low TSH - Likely sick euthyroid syndrome. No symptoms associated with hyperthyroidism. Repeat TSH in 4-6 weeks.   Moderate malnutrition - Seen and evaluated by dietician 09/11/15, continue nutritional supplements.    Acute on chronic blood loss anemia, post op related / B12 deficiency - Noted to have a drop in Hg from 11 --> 7.6 Hgb 9.9 after being given 2 units of PRBCs 09/10/15. - Will start on B12  supplementation given low B12 levels. Ferritin 344. Iron low.    Acute encephalopathy - Developed acutely postoperatively. Now resolved. Likely medication related.   E. Coli UTI (lower urinary tract infection) - Cultures growing E Coli, continued rocephin.  - transition to oral Cipro to complete therapy for three more days post discharge    Leukocytosis - This is likely multifactorial and secondary to hip fracture and UTI. Resolved.   High blood pressure associated with pain - BP stable with better pain control.   Acute hyponatremia - Pre renal in etiology, resolved with IVF.   DVT prophylaxis  - SCD's, aspirin 325 mg PO QD   Code Status: DNR Family Communication: plan of care discussed with the patient and niece at bedside  Disposition Plan: SNF today   Procedures and diagnostic studies:    Imaging Results    Dg Chest 1 View 10/2/2016No acute cardiopulmonary process seen. No displaced rib fractures.  Dg Hip Unilat With Pelvis 2-3 Views Right 09/08/2015 Comminuted intertrochanteric fracture with displacement of the fracture fragments. Slight varus angulation. No pelvic fracture.  Dg Femur, Min 2 Views Right 10/3/2016Improved alignment of proximal femur fracture post ORIF.    Medical Consultants:    Orthopedic Surgery: Kathryne Hitch, MD      Discharge Exam: Filed Vitals:   09/11/15 1955  BP: 91/32  Pulse: 68  Temp: 98.1 F (36.7 C)  Resp: 14   Filed Vitals:   09/10/15 2115 09/11/15 0552 09/11/15 1418 09/11/15 1955  BP: 107/45 118/49 104/54 91/32  Pulse: 75 79 75  68  Temp: 99 F (37.2 C) 100 F (37.8 C) 98.4 F (36.9 C) 98.1 F (36.7 C)  TempSrc: Oral Oral Oral Oral  Resp: Height:      Weight:      SpO2: 99% 98% 95% 98%    General: Pt is alert, follows commands appropriately, not in acute distress Cardiovascular: Regular rate and rhythm, no rubs, no gallops Respiratory: Clear to auscultation  bilaterally, no wheezing, no crackles, no rhonchi Abdominal: Soft, non tender, non distended, bowel sounds +, no guarding   Discharge Instructions  Discharge Instructions    Diet - low sodium heart healthy    Complete by:  As directed      Increase activity slowly    Complete by:  As directed      Partial weight bearing    Complete by:  As directed   % Body Weight:  50%  Laterality:  right  Extremity:  Lower            Medication List    TAKE these medications        aspirin 325 MG EC tablet  Take 1 tablet (325 mg total) by mouth daily with breakfast.     bisacodyl 10 MG suppository  Commonly known as:  DULCOLAX  Place 1 suppository (10 mg total) rectally daily as needed for moderate constipation.     ciprofloxacin 500 MG tablet  Commonly known as:  CIPRO  Take 1 tablet (500 mg total) by mouth 2 (two) times daily.     docusate sodium 100 MG capsule  Commonly known as:  COLACE  Take 1 capsule (100 mg total) by mouth 2 (two) times daily.     oxyCODONE-acetaminophen 5-325 MG tablet  Commonly known as:  ROXICET  Take 1 tablet by mouth every 4 (four) hours as needed for severe pain.     polyethylene glycol packet  Commonly known as:  MIRALAX / GLYCOLAX  Take 17 g by mouth daily as needed for mild constipation.     senna 8.6 MG Tabs tablet  Commonly known as:  SENOKOT  Take 1 tablet (8.6 mg total) by mouth 2 (two) times daily.           Follow-up Information    Follow up with Kathryne Hitch, MD. Schedule an appointment as soon as possible for a visit in 2 weeks.   Specialty:  Orthopedic Surgery   Contact information:   [address] [place] [address] [phone]       Call Debbora Presto, MD.   Specialty:  Internal Medicine   Why:  As needed   Contact information:   [address] [place] [address] [phone]        The results of significant diagnostics from this hospitalization (including imaging,  microbiology, ancillary and laboratory) are listed below for reference.     Microbiology: Recent Results (from the past 240 hour(s))  Urine culture     Status: None (Preliminary result)   Collection Time: 09/08/15  9:57 PM  Result Value Ref Range Status   Specimen Description URINE, CLEAN CATCH  Final   Special Requests NONE  Final   Culture   Final    >=100,000 COLONIES/mL ESCHERICHIA COLI Performed at [facility]    Report Status PENDING  Incomplete  Surgical pcr screen     Status: None   Collection Time: 09/09/15  2:00 PM  Result Value Ref Range Status  MRSA, PCR NEGATIVE NEGATIVE Final   Staphylococcus aureus NEGATIVE NEGATIVE Final    Comment:        The Xpert SA Assay (FDA approved for NASAL specimens in patients over 64 years of age), is one component of a comprehensive surveillance program.  Test performance has been validated by [facility] for patients greater than or equal to 24 year old. It is not intended to diagnose infection nor to guide or monitor treatment.      Labs: Basic Metabolic Panel:  Recent Labs Lab 09/08/15 1941 09/09/15 0455 09/10/15 0900 09/11/15 0510  NA 139 134* 136 137  K 4.2 4.1 3.8 4.0  CL 105 101 103 104  CO2 GLUCOSE 135* 175* 126* 121*  BUN CREATININE 0.77 0.85 0.74 0.72  CALCIUM 9.2 8.5* 8.4* 8.5*   CBC:  Recent Labs Lab 09/08/15 1941 09/09/15 0455 09/10/15 0900 09/11/15 0510  WBC 12.6* 8.1 8.3 8.0  NEUTROABS 10.7*  --   --   --   HGB 11.2* 9.2* 7.6* 9.9*  HCT 35.0* 29.0* 22.9* 29.4*  MCV 89.7 89.0 87.7 86.7  PLT 214 197 168 136*     SIGNED: Time coordinating discharge: 30 minutes  MAGICK-Lenville Hibberd, MD  [facility] 09/12/2015, 10:16 AM Pager [phone]  If 7PM-7AM, please contact night-coverage [email or web address] Password [email or web address]

## 2015-09-12 NOTE — Progress Notes (Signed)
Subjective: 3 Days Post-Op Procedure(s) (LRB): INTRAMEDULLARY (IM) NAIL INTERTROCHANTRIC (Right) Patient reports pain as mild.    Objective: Vital signs in last 24 hours: Temp:  [98.1 F (36.7 C)-98.4 F (36.9 C)] 98.1 F (36.7 C) (10/05 1955) Pulse Rate:  [68-75] 68 (10/05 1955) Resp:  [14-16] 14 (10/05 1955) BP: (91-104)/(32-54) 91/32 mmHg (10/05 1955) SpO2:  [95 %-98 %] 98 % (10/05 1955)  Intake/Output from previous day: 10/05 0701 - 10/06 0700 In: 480 [P.O.:480] Out: 950 [Urine:950] Intake/Output this shift: Total I/O In: -  Out: 400 [Urine:400]   Recent Labs  09/10/15 0900 09/11/15 0510  HGB 7.6* 9.9*    Recent Labs  09/10/15 0900 09/11/15 0510  WBC 8.3 8.0  RBC 2.61*  2.61* 3.39*  HCT 22.9* 29.4*  PLT 168 136*    Recent Labs  09/10/15 0900 09/11/15 0510  NA 136 137  K 3.8 4.0  CL 103 104  CO2 25 26  BUN 15 16  CREATININE 0.74 0.72  GLUCOSE 126* 121*  CALCIUM 8.4* 8.5*   No results for input(s): LABPT, INR in the last 72 hours.  Sensation intact distally Intact pulses distally Incision: dressing C/D/I Compartment soft  Assessment/Plan: 3 Days Post-Op Procedure(s) (LRB): INTRAMEDULLARY (IM) NAIL INTERTROCHANTRIC (Right) Up with therapy  Follow up with Dr. Magnus Ivan in office at 2 weeks post-op  Shelly Glenn 09/12/2015, 1:16 PM

## 2015-09-12 NOTE — Progress Notes (Signed)
Pt ready for d/c to [facility]. Pt / niece are in agreement with d/c plan. [facility] transport required. Pt / niece are aware out of pocket costs may be associated with [facility] transport. NSG reviewed d/c summary, scripts, avs. Scripts included in d/c packet.  Cori Razor LCSW [phone]

## 2015-09-12 NOTE — Progress Notes (Addendum)
PT Cancellation Note  Patient Details Name: Shelly Glenn MRN: [id number] DOB: [birth year]   Cancelled Treatment:    Reason Eval/Treat Not Completed: Pt declined participation this am. Pt requested to rest. Per pt and niece, pt was up and down to [facility] several times overnight. Pt plans to d/c to SNF later today.    Rebeca Alert, MPT Pager: [phone]

## 2015-09-12 NOTE — Discharge Instructions (Signed)
Only 50% weight on her right hip. New dry dressing over right hip incisions daily as needed. Can get incisions wet in the shower  Hip Fracture A hip fracture is a fracture of the upper part of your thigh bone (femur).  CAUSES A hip fracture is caused by a direct blow to the side of your hip. This is usually the result of a fall but can occur in other circumstances, such as an automobile accident. RISK FACTORS There is an increased risk of hip fractures in people with:  An unsteady walking pattern (gait) and those with conditions that contribute to poor balance, such as Parkinson's disease or dementia.  Osteopenia and osteoporosis.  Cancer that spreads to the leg bones.  Certain metabolic diseases. SYMPTOMS  Symptoms of hip fracture include:  Pain over the injured hip.  Inability to put weight on the leg in which the fracture occurred (although, some patients are able to walk after a hip fracture).  Toes and foot of the affected leg point outward when you lie down. DIAGNOSIS A physical exam can determine if a hip fracture is likely to have occurred. X-ray exams are needed to confirm the fracture and to look for other injuries. The X-ray exam can help to determine the type of hip fracture. Rarely, the fracture is not visible on an X-ray image and a CT scan or MRI will have to be done. TREATMENT  The treatment for a fracture is usually surgery. This means using a screw, nail, or rod to hold the bones in place.  HOME CARE INSTRUCTIONS Take all medicines as directed by your health care provider. SEEK MEDICAL CARE IF: Pain continues, even after taking pain medicine. MAKE SURE YOU:  Understand these instructions.   Will watch your condition.  Will get help right away if you are not doing well or get worse.   This information is not intended to replace advice given to you by your health care provider. Make sure you discuss any questions you have with your health care provider.     Document Released: 11/23/2005 Document Revised: 11/28/2013 Document Reviewed: 07/05/2013 Elsevier Interactive Patient Education [employer]

## 2015-09-12 NOTE — Progress Notes (Signed)
Pt has SNF bed at [facility] today if stable for d/c.  Cori Razor LCSW [phone]

## 2015-12-08 DEATH — deceased

## 2016-03-24 IMAGING — CR DG HIP (WITH OR WITHOUT PELVIS) 2-3V*R*
3 series · 3 of 3 positions shown · non-contrast
Comparison: None.

CLINICAL DATA: Resident [REDACTED]. Fell on RIGHT
side. Pain.

EXAM:
DG HIP (WITH OR WITHOUT PELVIS) 2-3V RIGHT

[x pelvis]
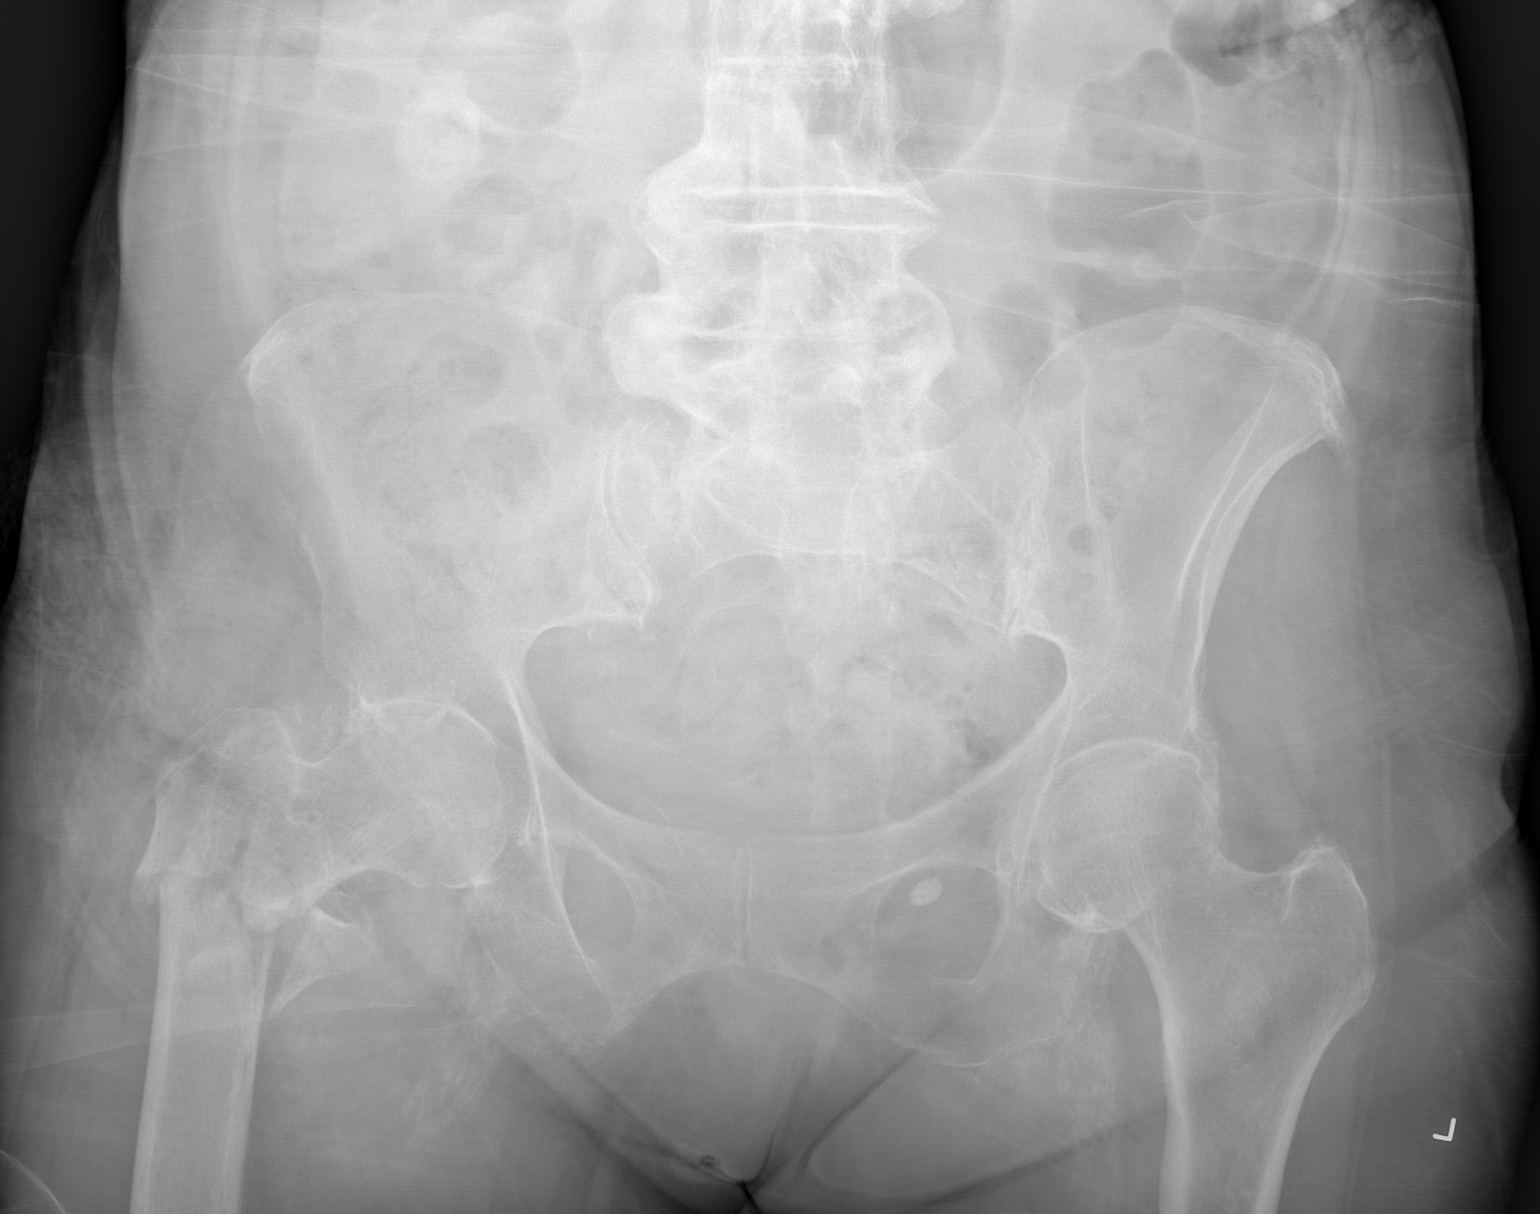

[x hip ap right]
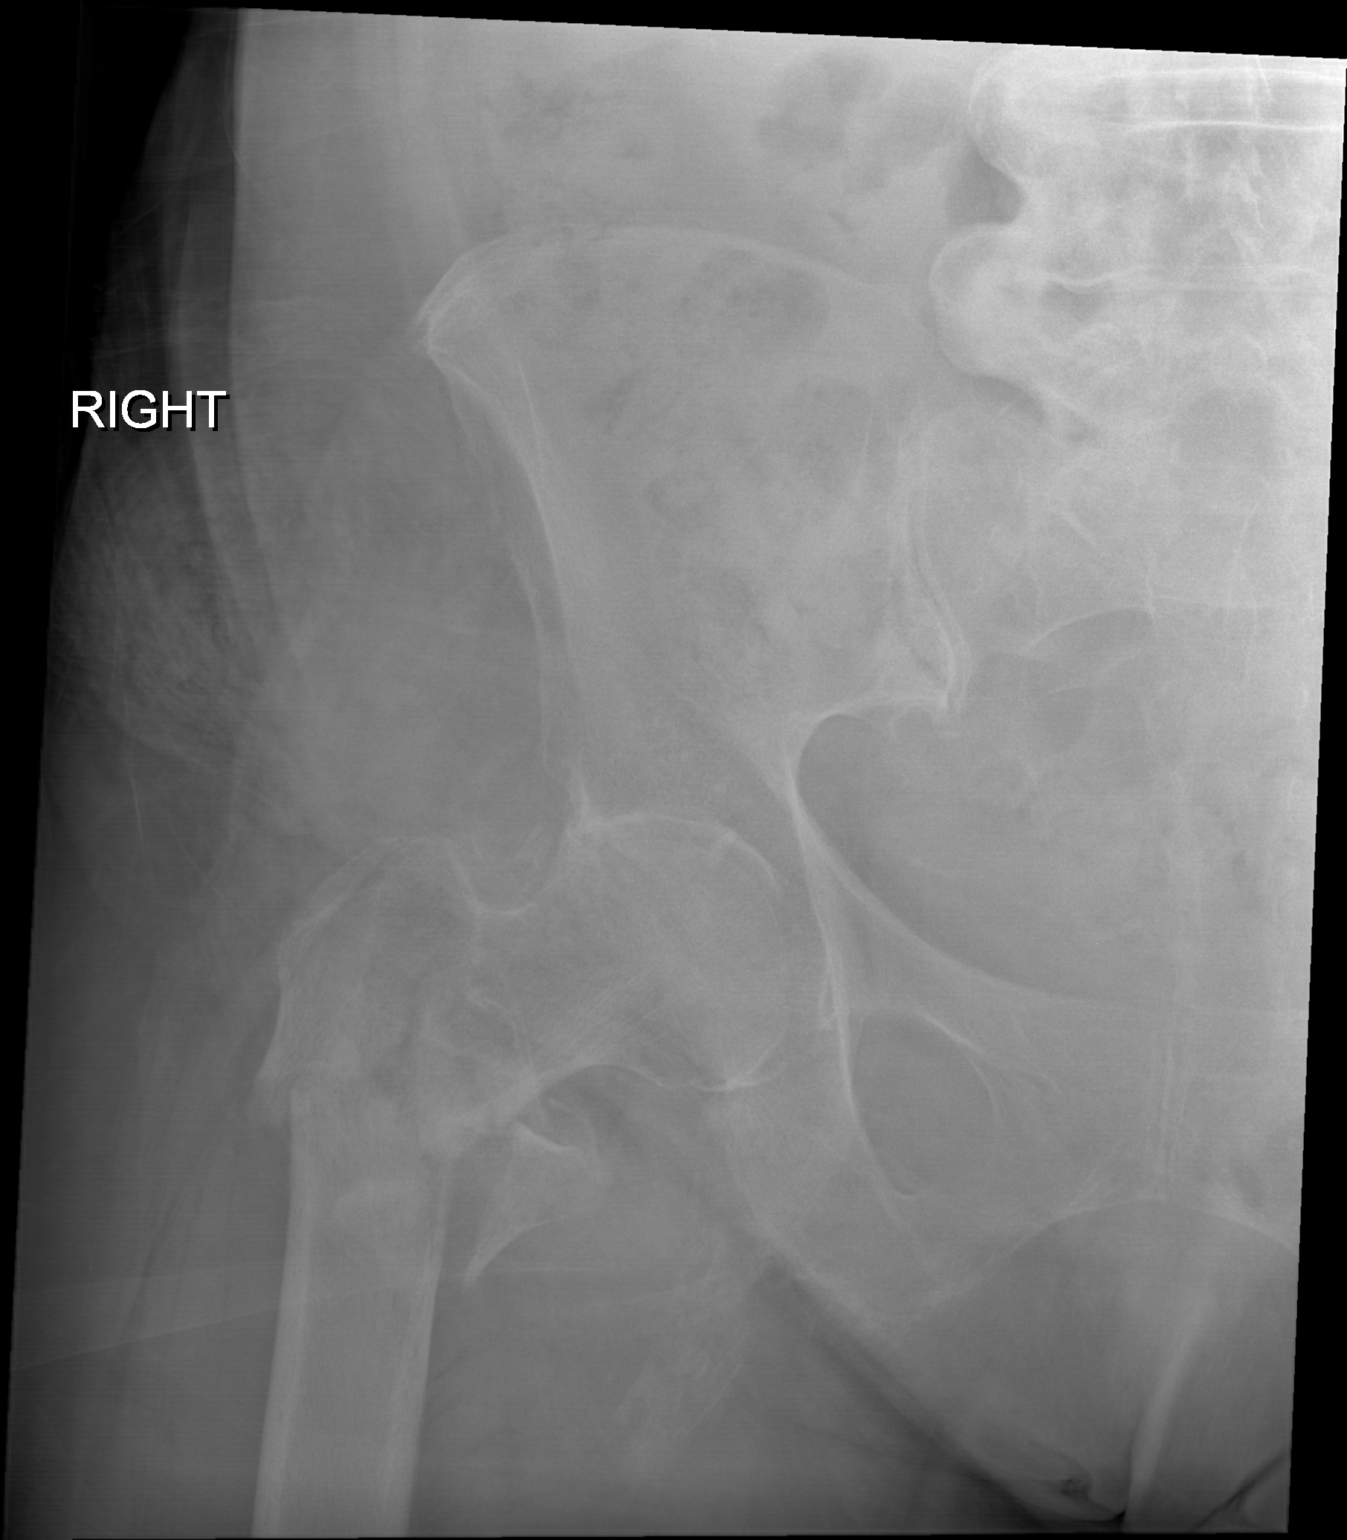

[w hip lat right]
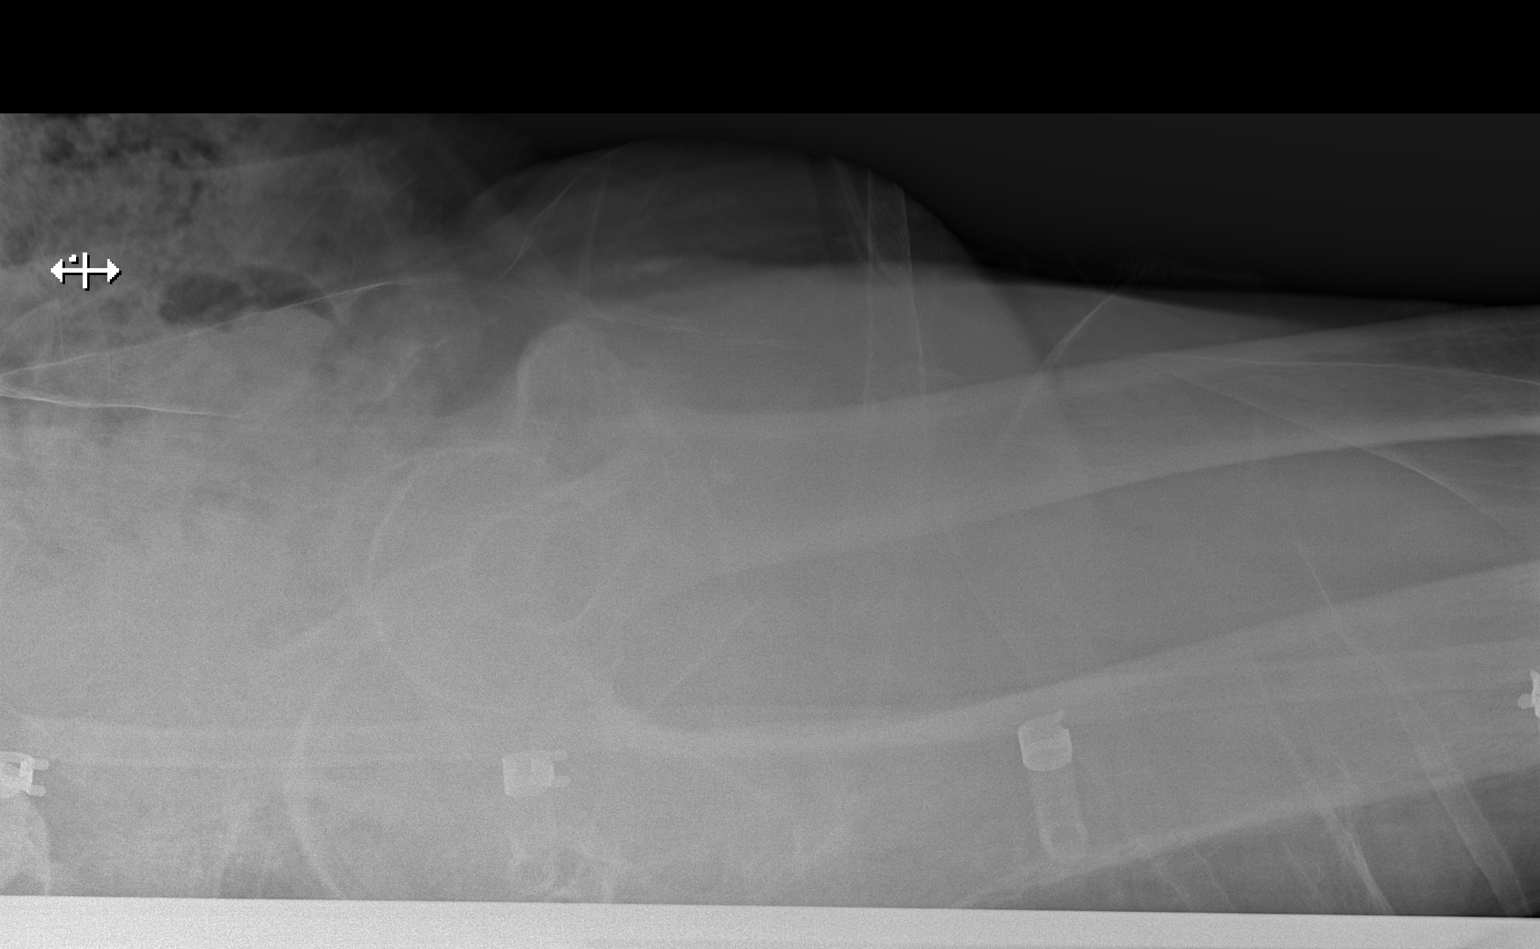

[3 of 3 positions shown; findings below may reference images not displayed]

FINDINGS: There is a comminuted intertrochanteric fracture with displacement
of fragments. Slight varus angulation. No pelvic fracture.
Osteopenia. Degenerative change lumbar spine.
IMPRESSION: Comminuted intertrochanteric fracture with displacement of the
fracture fragments. Slight varus angulation. No pelvic fracture.
